# Patient Record
Sex: Male | Born: 1960
Health system: Southern US, Community
[De-identification: ages and names within clinical notes are randomized; demographics above are authoritative.]

---

## 2001-02-08 ENCOUNTER — Other Ambulatory Visit: Admission: RE | Admit: 2001-02-08 | Discharge: 2001-02-08 | Payer: Self-pay | Admitting: Urology

## 2003-01-30 ENCOUNTER — Emergency Department (HOSPITAL_COMMUNITY): Admission: EM | Admit: 2003-01-30 | Discharge: 2003-01-30 | Payer: Self-pay | Admitting: Emergency Medicine

## 2014-10-09 ENCOUNTER — Other Ambulatory Visit: Payer: Self-pay

## 2014-10-09 ENCOUNTER — Other Ambulatory Visit: Payer: Self-pay | Admitting: Family Medicine

## 2014-10-09 ENCOUNTER — Other Ambulatory Visit (HOSPITAL_COMMUNITY): Payer: Self-pay | Admitting: Family Medicine

## 2014-10-09 ENCOUNTER — Ambulatory Visit (HOSPITAL_COMMUNITY): Payer: BLUE CROSS/BLUE SHIELD | Attending: Cardiovascular Disease

## 2014-10-09 DIAGNOSIS — R002 Palpitations: Secondary | ICD-10-CM

## 2016-07-25 ENCOUNTER — Ambulatory Visit (INDEPENDENT_AMBULATORY_CARE_PROVIDER_SITE_OTHER): Payer: BLUE CROSS/BLUE SHIELD | Admitting: Internal Medicine

## 2016-07-25 DIAGNOSIS — Z9189 Other specified personal risk factors, not elsewhere classified: Secondary | ICD-10-CM

## 2016-07-25 DIAGNOSIS — Z789 Other specified health status: Secondary | ICD-10-CM | POA: Diagnosis not present

## 2016-07-25 DIAGNOSIS — Z23 Encounter for immunization: Secondary | ICD-10-CM

## 2016-07-25 DIAGNOSIS — Z7184 Encounter for health counseling related to travel: Secondary | ICD-10-CM

## 2016-07-25 DIAGNOSIS — Z7189 Other specified counseling: Secondary | ICD-10-CM | POA: Diagnosis not present

## 2016-07-25 MED ORDER — AZITHROMYCIN 500 MG PO TABS: 500.0000 mg | ORAL_TABLET | Freq: Every day | ORAL | 0 refills | Status: DC

## 2016-07-25 NOTE — Progress Notes (Signed)
   Subjective:   Jacobee Heyman is a 56 y.o. male who presents to the Infectious Disease clinic for travel consultation. Planned departure date: 3/31 Planned return date: 4/3 Countries of travel: [place] Areas in country: urban Accommodations: hotel. Plus camping Purpose of travel: white water rafting trip with family of 8 Prior travel out of [id number], [place], [place], [place], [place]  Had tdap in 2016   Objective:   Medications:none    Assessment:   No contraindications to travel. none   Plan:   Mosquito bite prevention = discussed how to prevent bites with DEET and premethrin  Pre travel vaccines = will give hep A #1  Traveler's diarrhea = gave recs for avoidance of tap water, stick with bottled water, can use peptobismal, immodium and if needed azithromycin   [facility]

## 2016-07-25 NOTE — Patient Instructions (Signed)
[facility]                [address], [place]                   [place],  [address]                      Phone: [phone]                        Fax: [phone]   Planned departure date: 3/31   Planned return date: 4/4 Countries of travel:  [place]  Guidelines for the Prevention & Treatment of Traveler's Diarrhea  Prevention: "Boil it, Peel it, Lacinda Axon it, or Forget it"   the fewer chances -> lower risk: try to stick to food & water precautions as much as possible"   If it's "piping hot"; it is probably okay, if not, it may not be   Treatment   1) You should always take care to drink lots of fluids in order to avoid dehydration   2) You should bring medications with you in case you come down with a case of diarrhea   3) OTC = bring pepto-bismol - can take with initial abdominal symptoms;                    Imodium - can help slow down your intestinal tract, can help relief cramps                    and diarrhea, can take if no bloody diarrhea  Use azithromycin if needed for traveler's diarrhea  Guidelines for the Prevention of Malaria  Avoidance:  -fewer mosquito bites = lower risk. Mosquitos can bite at night as well as daytime  -cover up (long sleeve clothing), mosquito nets, screens  -Insect repellent for your skin ( DEET containing lotion > 20%): for clothes ( permethrin spray)     Immunizations received today: hepatitis A, 1st dose Future immunizations, if indicated -- hepatitis A, 2nd dose so that you have life long immunity  Prior to travel:  1) Be sure to pick up appropriate prescriptions, including medicine you take daily. Do not expect to be able to fill your prescriptions abroad.  2) Strongly consider obtaining traveler's insurance, including emergency evacuation insurance. Most plans in the [place] do not cover participants abroad. (see below for resources)  3) Register at the appropriate [place]  embassy or consulate with travel dates so they are aware of your presence in-country and for helpful advice during travel using the [employer] (STEP, GreenNylon.com.cy).  4) Leave contact information with a relative or friend.  5) Keep a Research officer, political party, credit cards in case they become lost or stolen  6) Inform your credit card company that you will be travelling abroad   During travel:  1) If you become ill and need medical advice, the [place] [employer]	[place] of the country you are traveling in general provides a list of [place] speaking doctors.  We are also available on MyChart for remote consultation if you register prior to travel. 2) Avoid motorcycles or scooters when at all possible. Traffic laws in many countries are lax and accidents occur frequently.  3) Do not take any unnecessary risks that you wouldn't do at home.   Resources:  -Country specific information: BlindResource.ca or GreenNylon.com.cy  -Press photographer (DEET,  mosquito nets): [employer], [employer] store, [employer], [place] insurance options: gatewayplans.com; http://clayton-rivera.info/; travelguard.com or [employer], gninsurance.com or info@gninsurance .com, (747) 315-8485.   Post Travel:  If you return from your trip ill, call your primary care doctor or our travel clinic @ [phone].   Enjoy your trip and know that with proper pre-travel preparation, most people have an enjoyable and uninterrupted trip!

## 2016-10-07 DIAGNOSIS — R21 Rash and other nonspecific skin eruption: Secondary | ICD-10-CM | POA: Diagnosis not present

## 2017-08-29 DIAGNOSIS — F4320 Adjustment disorder, unspecified: Secondary | ICD-10-CM | POA: Diagnosis not present

## 2017-09-05 DIAGNOSIS — F4320 Adjustment disorder, unspecified: Secondary | ICD-10-CM | POA: Diagnosis not present

## 2017-09-16 DIAGNOSIS — M25529 Pain in unspecified elbow: Secondary | ICD-10-CM | POA: Diagnosis not present

## 2017-09-19 DIAGNOSIS — F4320 Adjustment disorder, unspecified: Secondary | ICD-10-CM | POA: Diagnosis not present

## 2017-10-05 ENCOUNTER — Ambulatory Visit: Payer: BLUE CROSS/BLUE SHIELD | Admitting: Family Medicine

## 2017-10-10 DIAGNOSIS — F4320 Adjustment disorder, unspecified: Secondary | ICD-10-CM | POA: Diagnosis not present

## 2017-10-23 DIAGNOSIS — L723 Sebaceous cyst: Secondary | ICD-10-CM | POA: Diagnosis not present

## 2017-10-23 DIAGNOSIS — L989 Disorder of the skin and subcutaneous tissue, unspecified: Secondary | ICD-10-CM | POA: Diagnosis not present

## 2017-10-23 DIAGNOSIS — G5612 Other lesions of median nerve, left upper limb: Secondary | ICD-10-CM | POA: Diagnosis not present

## 2017-10-24 DIAGNOSIS — F4320 Adjustment disorder, unspecified: Secondary | ICD-10-CM | POA: Diagnosis not present

## 2017-11-14 DIAGNOSIS — F4320 Adjustment disorder, unspecified: Secondary | ICD-10-CM | POA: Diagnosis not present

## 2017-12-05 DIAGNOSIS — F4320 Adjustment disorder, unspecified: Secondary | ICD-10-CM | POA: Diagnosis not present

## 2017-12-19 DIAGNOSIS — F4320 Adjustment disorder, unspecified: Secondary | ICD-10-CM | POA: Diagnosis not present

## 2017-12-23 DIAGNOSIS — Z0000 Encounter for general adult medical examination without abnormal findings: Secondary | ICD-10-CM | POA: Diagnosis not present

## 2017-12-23 DIAGNOSIS — R7301 Impaired fasting glucose: Secondary | ICD-10-CM | POA: Diagnosis not present

## 2017-12-23 DIAGNOSIS — G5612 Other lesions of median nerve, left upper limb: Secondary | ICD-10-CM | POA: Diagnosis not present

## 2017-12-23 DIAGNOSIS — Z720 Tobacco use: Secondary | ICD-10-CM | POA: Diagnosis not present

## 2017-12-26 DIAGNOSIS — F4320 Adjustment disorder, unspecified: Secondary | ICD-10-CM | POA: Diagnosis not present

## 2018-01-05 DIAGNOSIS — L700 Acne vulgaris: Secondary | ICD-10-CM | POA: Diagnosis not present

## 2018-01-16 DIAGNOSIS — F4320 Adjustment disorder, unspecified: Secondary | ICD-10-CM | POA: Diagnosis not present

## 2018-01-30 DIAGNOSIS — F4320 Adjustment disorder, unspecified: Secondary | ICD-10-CM | POA: Diagnosis not present

## 2018-02-26 DIAGNOSIS — Z86010 Personal history of colonic polyps: Secondary | ICD-10-CM | POA: Diagnosis not present

## 2018-02-26 DIAGNOSIS — D123 Benign neoplasm of transverse colon: Secondary | ICD-10-CM | POA: Diagnosis not present

## 2018-02-26 DIAGNOSIS — K640 First degree hemorrhoids: Secondary | ICD-10-CM | POA: Diagnosis not present

## 2018-03-02 DIAGNOSIS — Z86010 Personal history of colonic polyps: Secondary | ICD-10-CM | POA: Diagnosis not present

## 2018-03-02 DIAGNOSIS — D123 Benign neoplasm of transverse colon: Secondary | ICD-10-CM | POA: Diagnosis not present

## 2018-03-06 DIAGNOSIS — F4320 Adjustment disorder, unspecified: Secondary | ICD-10-CM | POA: Diagnosis not present

## 2018-03-16 DIAGNOSIS — R7989 Other specified abnormal findings of blood chemistry: Secondary | ICD-10-CM | POA: Diagnosis not present

## 2018-03-16 DIAGNOSIS — F17210 Nicotine dependence, cigarettes, uncomplicated: Secondary | ICD-10-CM | POA: Diagnosis not present

## 2018-03-16 DIAGNOSIS — R079 Chest pain, unspecified: Secondary | ICD-10-CM | POA: Diagnosis not present

## 2018-03-16 DIAGNOSIS — R0789 Other chest pain: Secondary | ICD-10-CM | POA: Diagnosis not present

## 2018-04-17 DIAGNOSIS — F4320 Adjustment disorder, unspecified: Secondary | ICD-10-CM | POA: Diagnosis not present

## 2018-05-08 DIAGNOSIS — F4320 Adjustment disorder, unspecified: Secondary | ICD-10-CM | POA: Diagnosis not present

## 2018-06-24 DIAGNOSIS — D229 Melanocytic nevi, unspecified: Secondary | ICD-10-CM | POA: Diagnosis not present

## 2018-06-24 DIAGNOSIS — L723 Sebaceous cyst: Secondary | ICD-10-CM | POA: Diagnosis not present

## 2018-07-17 DIAGNOSIS — F4320 Adjustment disorder, unspecified: Secondary | ICD-10-CM | POA: Diagnosis not present

## 2018-09-23 DIAGNOSIS — F4320 Adjustment disorder, unspecified: Secondary | ICD-10-CM | POA: Diagnosis not present

## 2018-11-02 DIAGNOSIS — F4320 Adjustment disorder, unspecified: Secondary | ICD-10-CM | POA: Diagnosis not present

## 2019-03-16 DIAGNOSIS — E7800 Pure hypercholesterolemia, unspecified: Secondary | ICD-10-CM | POA: Diagnosis not present

## 2019-03-16 DIAGNOSIS — Z125 Encounter for screening for malignant neoplasm of prostate: Secondary | ICD-10-CM | POA: Diagnosis not present

## 2019-03-16 DIAGNOSIS — Z131 Encounter for screening for diabetes mellitus: Secondary | ICD-10-CM | POA: Diagnosis not present

## 2019-03-16 DIAGNOSIS — Z23 Encounter for immunization: Secondary | ICD-10-CM | POA: Diagnosis not present

## 2019-03-16 DIAGNOSIS — Z0000 Encounter for general adult medical examination without abnormal findings: Secondary | ICD-10-CM | POA: Diagnosis not present

## 2019-04-13 DIAGNOSIS — Z23 Encounter for immunization: Secondary | ICD-10-CM | POA: Diagnosis not present

## 2019-06-14 DIAGNOSIS — Z23 Encounter for immunization: Secondary | ICD-10-CM | POA: Diagnosis not present

## 2019-08-23 ENCOUNTER — Ambulatory Visit: Payer: BC Managed Care – PPO | Admitting: Physician Assistant

## 2019-08-23 ENCOUNTER — Encounter: Payer: Self-pay | Admitting: Physician Assistant

## 2019-08-23 ENCOUNTER — Other Ambulatory Visit: Payer: Self-pay

## 2019-08-23 DIAGNOSIS — Z1283 Encounter for screening for malignant neoplasm of skin: Secondary | ICD-10-CM

## 2019-08-23 DIAGNOSIS — L720 Epidermal cyst: Secondary | ICD-10-CM | POA: Diagnosis not present

## 2019-08-23 DIAGNOSIS — L820 Inflamed seborrheic keratosis: Secondary | ICD-10-CM | POA: Diagnosis not present

## 2019-08-23 NOTE — Progress Notes (Addendum)
   Follow-Up Visit   Subjective  Corey Lynch is a 59 y.o. male who presents for the following: Skin Problem (Patient here today to have a few spots checked on right upper chest, left upper chest and back x couple of months.  Patient denies any bleeding from the spots, patient denies any pain from the spots.).Brown crusts. No treatment. Persistent growing.  Location: R lower eyelid Duration: 1 yr Quality: white bump Associated Signs/Symptoms: none Modifying Factors: persistent Severity: growing    The following portions of the chart were reviewed this encounter and updated as appropriate: Tobacco  Allergies  Meds  Problems  Med Hx  Surg Hx  Fam Hx  Soc Hx      Objective  Well appearing patient in no apparent distress; mood and affect are within normal limits.  Waist up plus legs examined.   Objective  Right lower eye lid: White plaque  Objective  head to toe: No atypical nevi  Objective  Left chest, Right chest: Erythematous stuck-on, waxy papule or plaque.   Assessment & Plan  Milia Right lower eye lid- I&D  Screening exam for skin cancer head to toe -No atypical nevi noted at the time of the visit.  Seborrheic keratosis, inflamed (2) Left chest; Right chest  Destruction of lesion - Left chest, Right chest Complexity: simple   Destruction method: cryotherapy   Informed consent: discussed and consent obtained   Timeout:  patient name, date of birth, surgical site, and procedure verified Lesion destroyed using liquid nitrogen: Yes   Cryotherapy cycles:  1 Outcome: patient tolerated procedure well with no complications   Post-procedure details: wound care instructions given

## 2019-08-23 NOTE — Patient Instructions (Signed)

## 2019-09-08 ENCOUNTER — Encounter: Payer: Self-pay | Admitting: General Surgery

## 2019-09-08 ENCOUNTER — Other Ambulatory Visit: Payer: Self-pay

## 2019-09-08 ENCOUNTER — Ambulatory Visit: Payer: BC Managed Care – PPO | Admitting: General Surgery

## 2019-09-08 VITALS — BP 143/86 | HR 65 | Temp 97.9°F | Resp 12 | Ht 71.0 in | Wt 205.0 lb

## 2019-09-08 DIAGNOSIS — D170 Benign lipomatous neoplasm of skin and subcutaneous tissue of head, face and neck: Secondary | ICD-10-CM | POA: Diagnosis not present

## 2019-09-08 NOTE — Patient Instructions (Signed)
Lipoma  A lipoma is a noncancerous (benign) tumor that is made up of fat cells. This is a very common type of soft-tissue growth. Lipomas are usually found under the skin (subcutaneous). They may occur in any tissue of the body that contains fat. Common areas for lipomas to appear include the back, arms, shoulders, buttocks, and thighs. Lipomas grow slowly, and they are usually painless. Most lipomas do not cause problems and do not require treatment. What are the causes? The cause of this condition is not known. What increases the risk? You are more likely to develop this condition if:  You are 40-60 years old.  You have a family history of lipomas. What are the signs or symptoms? A lipoma usually appears as a small, round bump under the skin. In most cases, the lump will:  Feel soft or rubbery.  Not cause pain or other symptoms. However, if a lipoma is located in an area where it pushes on nerves, it can become painful or cause other symptoms. How is this diagnosed? A lipoma can usually be diagnosed with a physical exam. You may also have tests to confirm the diagnosis and to rule out other conditions. Tests may include:  Imaging tests, such as a CT scan or an MRI.  Removal of a tissue sample to be looked at under a microscope (biopsy). How is this treated? Treatment for this condition depends on the size of the lipoma and whether it is causing any symptoms.  For small lipomas that are not causing problems, no treatment is needed.  If a lipoma is bigger or it causes problems, surgery may be done to remove the lipoma. Lipomas can also be removed to improve appearance. Most often, the procedure is done after applying a medicine that numbs the area (local anesthetic).  Liposuction may be done to reduce the size of the lipoma before it is removed through surgery, or it may be done to remove the lipoma. Lipomas are removed with this method in order to limit incision size and scarring. A  liposuction tube is inserted through a small incision into the lipoma, and the contents of the lipoma are removed through the tube with suction. Follow these instructions at home:  Watch your lipoma for any changes.  Keep all follow-up visits as told by your health care provider. This is important. Contact a health care provider if:  Your lipoma becomes larger or hard.  Your lipoma becomes painful, red, or increasingly swollen. These could be signs of infection or a more serious condition. Get help right away if:  You develop tingling or numbness in an area near the lipoma. This could indicate that the lipoma is causing nerve damage. Summary  A lipoma is a noncancerous tumor that is made up of fat cells.  Most lipomas do not cause problems and do not require treatment.  If a lipoma is bigger or it causes problems, surgery may be done to remove the lipoma.  Contact a health care provider if your lipoma becomes larger or hard, or if it becomes painful, red, or increasingly swollen. Pain, redness, and swelling could be signs of infection or a more serious condition. This information is not intended to replace advice given to you by your health care provider. Make sure you discuss any questions you have with your health care provider. Document Revised: 12/27/2018 Document Reviewed: 12/27/2018 Elsevier Patient Education  2020 Elsevier Inc.  

## 2019-09-09 NOTE — Progress Notes (Signed)
Corey Lynch; [id number]; November 14, 1960   HPI Patient is a 59 year old white male who was referred to my care by Dr.Koirala for evaluation treatment of a cyst on his neck.  Patient has felt some tenderness and catching of the neck when extending it over the past year.  He did feel a small lump underneath the skin in the posterior neck and was told this could be a cyst.  No drainage has been noted.  No enlargement has been noted.  Patient denies any numbness or tingling extending down the left arm.  This occurs primarily in the left side of the neck.  He currently has 0 out of 10 neck pain. History reviewed. No pertinent past medical history.  History reviewed. No pertinent surgical history.  Family History  Problem Relation Age of Onset  . Basal cell carcinoma Brother   . Heart attack Mother     Current Outpatient Medications on File Prior to Visit  Medication Sig Dispense Refill  . acetaminophen (TYLENOL) 325 MG tablet Take by mouth.     No current facility-administered medications on file prior to visit.    No Known Allergies  Social History   Substance and Sexual Activity  Alcohol Use Yes    Social History   Tobacco Use  Smoking Status Current Every Day Smoker  Smokeless Tobacco Current User  Tobacco Comment   Patient states he hasn't had a cigarette in 10 days he's trying to quit    Review of Systems  Constitutional: Negative.   HENT: Negative.   Eyes: Negative.   Respiratory: Negative.   Cardiovascular: Negative.   Gastrointestinal: Negative.   Genitourinary: Negative.   Musculoskeletal: Negative.   Skin: Negative.   Neurological: Negative.   Endo/Heme/Allergies: Negative.   Psychiatric/Behavioral: Negative.     Objective   Vitals:   09/08/19 0921  BP: (!) 143/86  Pulse: 65  Resp: 12  Temp: 97.9 F (36.6 C)  SpO2: 98%    Physical Exam Vitals reviewed.  Constitutional:      Appearance: Normal appearance. He is not ill-appearing.  HENT:     Head:  Normocephalic and atraumatic.  Neck:     Comments: Small ill-defined oval lump approximately less than 1 cm in size is noted at the base of the neck just to the left of the midline.  It appears mobile in nature.  No skin changes overlying it are noted. Cardiovascular:     Rate and Rhythm: Normal rate and regular rhythm.     Heart sounds: Normal heart sounds. No murmur. No friction rub. No gallop.   Pulmonary:     Effort: Pulmonary effort is normal. No respiratory distress.     Breath sounds: Normal breath sounds. No stridor. No wheezing, rhonchi or rales.  Musculoskeletal:     Cervical back: Normal range of motion and neck supple. No rigidity or tenderness.  Lymphadenopathy:     Cervical: No cervical adenopathy.  Skin:    General: Skin is warm and dry.  Neurological:     Mental Status: He is alert and oriented to person, place, and time.     Assessment  Benign lipoma of the neck, possible small sebaceous cyst.  I do not feel this is the etiology of his neck discomfort.  This is probably musculoskeletal in nature and removing the knot would not be helpful. Plan   I do not recommend surgical excision at this time as the subcutaneous nodule is small and not the etiology of his symptoms.  He  understands and agrees.  I told him should he develop worsening symptoms or pain or tingling down the left arm, he should see a neurosurgeon.  Follow-up as needed.

## 2019-10-11 ENCOUNTER — Encounter: Payer: Self-pay | Admitting: Family Medicine

## 2019-10-11 ENCOUNTER — Ambulatory Visit: Payer: BC Managed Care – PPO | Admitting: Family Medicine

## 2019-10-11 ENCOUNTER — Other Ambulatory Visit: Payer: Self-pay

## 2019-10-11 DIAGNOSIS — M5030 Other cervical disc degeneration, unspecified cervical region: Secondary | ICD-10-CM

## 2019-10-11 DIAGNOSIS — M999 Biomechanical lesion, unspecified: Secondary | ICD-10-CM | POA: Diagnosis not present

## 2019-10-11 NOTE — Progress Notes (Signed)
[facility] Phone: [phone] Subjective:   Corey Lynch, am serving as a scribe for Dr. Hulan Saas. This visit occurred during the SARS-CoV-2 public health emergency.  Safety protocols were in place, including screening questions prior to the visit, additional usage of staff PPE, and extensive cleaning of exam room while observing appropriate contact time as indicated for disinfecting solutions.   I'm seeing this patient by the request  of:  Koirala, Dibas, MD  CC: Neck sounds mild discomfort  [id number]  Corey Lynch is a 59 y.o. male coming in with complaint of neck pain. Feels crunching in his neck with shoulder elevation. Is not having pain but having decreased range of motion.  Patient would like to make sure that is doing it worse.  Has noticed it more over the last several months.  Denies any radiation in the arms.  More of the sound that is more concerning than truly pain.     History reviewed. Lynch pertinent past medical history. History reviewed. Lynch pertinent surgical history. Social History   Socioeconomic History  . Marital status: Married    Spouse name: Not on file  . Number of children: Not on file  . Years of education: Not on file  . Highest education level: Not on file  Occupational History  . Not on file  Tobacco Use  . Smoking status: Current Every Day Smoker  . Smokeless tobacco: Current User  . Tobacco comment: Patient states he hasn't had a cigarette in 10 days he's trying to quit  Substance and Sexual Activity  . Alcohol use: Yes  . Drug use: Never  . Sexual activity: Not on file  Other Topics Concern  . Not on file  Social History Narrative  . Not on file   Social Determinants of Health   Financial Resource Strain:   . Difficulty of Paying Living Expenses:   Food Insecurity:   . Worried About Charity fundraiser in the Last Year:   . Arboriculturist in the Last  Year:   Transportation Needs:   . Film/video editor (Medical):   Marland Kitchen Lack of Transportation (Non-Medical):   Physical Activity:   . Days of Exercise per Week:   . Minutes of Exercise per Session:   Stress:   . Feeling of Stress :   Social Connections:   . Frequency of Communication with Friends and Family:   . Frequency of Social Gatherings with Friends and Family:   . Attends Religious Services:   . Active Member of Clubs or Organizations:   . Attends Archivist Meetings:   Marland Kitchen Marital Status:    Lynch Known Allergies Family History  Problem Relation Age of Onset  . Basal cell carcinoma Brother   . Heart attack Mother        Current Outpatient Medications (Analgesics):  .  acetaminophen (TYLENOL) 325 MG tablet, Take by mouth.     Reviewed prior external information including notes and imaging from  primary care provider As well as notes that were available from care everywhere and other healthcare systems.  Past medical history, social, surgical and family history all reviewed in electronic medical record.  Lynch pertanent information unless stated regarding to the chief complaint.   Review of Systems:  Lynch headache, visual changes, nausea, vomiting, diarrhea, constipation, dizziness, abdominal pain, skin rash, fevers, chills, night sweats, weight loss, swollen lymph nodes, body aches, joint swelling, chest  pain, shortness of breath, mood changes. POSITIVE muscle aches  Objective  Blood pressure 114/72, pulse 62, height 5\' 11"  (1.803 m), weight 201 lb (91.2 kg), SpO2 99 %.   General: Lynch apparent distress alert and oriented x3 mood and affect normal, dressed appropriately.  HEENT: Pupils equal, extraocular movements intact  Respiratory: Patient's speak in full sentences and does not appear short of breath  Cardiovascular: Lynch lower extremity edema, non tender, Lynch erythema  Neuro: Cranial nerves II through XII are intact, neurovascularly intact in all extremities  with 2+ DTRs and 2+ pulses.  Gait normal with good balance and coordination.  MSK:  Non tender with full range of motion and good stability and symmetric strength and tone of shoulders, elbows, wrist, hip, knee and ankles bilaterally.  Neck exam shows the patient does have some mild loss of lordosis.  Patient does have forward displacement of the neck.  Mild scapular dyskinesis on the left greater than the right.  Tightness noted.  Patient does have a lipoma over the C4-C5 area.  Negative Spurling's.  5-5 strength of the upper extremities.  Osteopathic findings  C4 flexed rotated and side bent left C6 flexed rotated and side bent left T3 extended rotated and side bent left inhaled third rib   [id number]; 15 additional minutes spent for Therapeutic exercises as stated in above notes.  This included exercises focusing on stretching, strengthening, with significant focus on eccentric aspects.   Long term goals include an improvement in range of motion, strength, endurance as well as avoiding reinjury. Patient's frequency would include in 1-2 times a day, 3-5 times a week for a duration of 6-12 weeks. Shoulder Exercises that included:  Basic scapular stabilization to include adduction and depression of scapula Scaption, focusing on proper movement and good control Internal and External rotation utilizing a theraband, with elbow tucked at side entire time Rows with theraband which was given   Proper technique shown and discussed handout in great detail with ATC.  All questions were discussed and answered.       Impression and Recommendations:     This case required medical decision making of moderate complexity. The above documentation has been reviewed and is accurate and complete Lyndal Pulley, DO       Note: This dictation was prepared with Dragon dictation along with smaller phrase technology. Any transcriptional errors that result from this process are unintentional.

## 2019-10-11 NOTE — Assessment & Plan Note (Signed)
   Decision today to treat with OMT was based on Physical Exam  After verbal consent patient was treated with HVLA, ME, FPR techniques in cervical, thoracic areas,   Patient tolerated the procedure well with improvement in symptoms  Patient given exercises, stretches and lifestyle modifications  See medications in patient instructions if given  Patient will follow up in 4-8 weeks

## 2019-10-11 NOTE — Patient Instructions (Signed)
Scapular exercises Monitor at eye level Wireless keyboard and mouse Keep shoulder blades back when sitting long time See me in 6-8 weeks

## 2019-10-11 NOTE — Assessment & Plan Note (Signed)
Mild overall, left-sided lipoma noted at the C4-C5 level.  Seems to be freely movable and minorly tender.  Patient does have very mild crepitus.  Does have some scapular dyskinesis.  Responded well to osteopathic manipulation.  Discussed the over-the-counter Tylenol.  Patient would like to avoid any prescription medications.  We discussed ergonomics that I think would be beneficial.  Follow-up with me again 6 to 8 weeks

## 2019-11-05 DIAGNOSIS — J029 Acute pharyngitis, unspecified: Secondary | ICD-10-CM | POA: Diagnosis not present

## 2019-11-05 DIAGNOSIS — Z03818 Encounter for observation for suspected exposure to other biological agents ruled out: Secondary | ICD-10-CM | POA: Diagnosis not present

## 2019-11-22 ENCOUNTER — Ambulatory Visit: Payer: BC Managed Care – PPO | Admitting: Family Medicine

## 2019-12-09 DIAGNOSIS — S61012A Laceration without foreign body of left thumb without damage to nail, initial encounter: Secondary | ICD-10-CM | POA: Diagnosis not present

## 2019-12-20 DIAGNOSIS — Z4802 Encounter for removal of sutures: Secondary | ICD-10-CM | POA: Diagnosis not present

## 2020-04-04 DIAGNOSIS — Z23 Encounter for immunization: Secondary | ICD-10-CM | POA: Diagnosis not present

## 2020-04-04 DIAGNOSIS — R7301 Impaired fasting glucose: Secondary | ICD-10-CM | POA: Diagnosis not present

## 2020-04-04 DIAGNOSIS — Z0000 Encounter for general adult medical examination without abnormal findings: Secondary | ICD-10-CM | POA: Diagnosis not present

## 2020-04-04 DIAGNOSIS — Z13220 Encounter for screening for lipoid disorders: Secondary | ICD-10-CM | POA: Diagnosis not present

## 2020-04-04 DIAGNOSIS — R7309 Other abnormal glucose: Secondary | ICD-10-CM | POA: Diagnosis not present

## 2020-04-04 DIAGNOSIS — Z125 Encounter for screening for malignant neoplasm of prostate: Secondary | ICD-10-CM | POA: Diagnosis not present

## 2020-04-15 DIAGNOSIS — Z20822 Contact with and (suspected) exposure to covid-19: Secondary | ICD-10-CM | POA: Diagnosis not present

## 2020-04-30 ENCOUNTER — Telehealth: Payer: Self-pay | Admitting: Acute Care

## 2020-04-30 DIAGNOSIS — F17210 Nicotine dependence, cigarettes, uncomplicated: Secondary | ICD-10-CM

## 2020-05-03 NOTE — Telephone Encounter (Signed)
Spoke with pt and scheduled SDMV 06/04/20  CT ordered Nothing further needed

## 2020-05-18 DIAGNOSIS — Z03818 Encounter for observation for suspected exposure to other biological agents ruled out: Secondary | ICD-10-CM | POA: Diagnosis not present

## 2020-05-18 DIAGNOSIS — R051 Acute cough: Secondary | ICD-10-CM | POA: Diagnosis not present

## 2020-06-04 ENCOUNTER — Ambulatory Visit
Admission: RE | Admit: 2020-06-04 | Discharge: 2020-06-04 | Disposition: A | Discharge status: Home / Self Care | Payer: BC Managed Care – PPO | Source: Ambulatory Visit | Attending: Acute Care | Admitting: Acute Care

## 2020-06-04 ENCOUNTER — Other Ambulatory Visit: Payer: Self-pay

## 2020-06-04 ENCOUNTER — Ambulatory Visit (INDEPENDENT_AMBULATORY_CARE_PROVIDER_SITE_OTHER): Payer: BC Managed Care – PPO | Admitting: Acute Care

## 2020-06-04 ENCOUNTER — Encounter: Payer: Self-pay | Admitting: Acute Care

## 2020-06-04 VITALS — BP 120/72 | HR 64 | Temp 97.1°F | Ht 73.0 in | Wt 210.0 lb

## 2020-06-04 DIAGNOSIS — J439 Emphysema, unspecified: Secondary | ICD-10-CM | POA: Diagnosis not present

## 2020-06-04 DIAGNOSIS — Z87891 Personal history of nicotine dependence: Secondary | ICD-10-CM | POA: Diagnosis not present

## 2020-06-04 DIAGNOSIS — F17210 Nicotine dependence, cigarettes, uncomplicated: Secondary | ICD-10-CM | POA: Diagnosis not present

## 2020-06-04 DIAGNOSIS — I700 Atherosclerosis of aorta: Secondary | ICD-10-CM | POA: Diagnosis not present

## 2020-06-04 DIAGNOSIS — I2510 Atherosclerotic heart disease of native coronary artery without angina pectoris: Secondary | ICD-10-CM | POA: Diagnosis not present

## 2020-06-04 NOTE — Progress Notes (Addendum)
Shared Decision Making Visit Lung Cancer Screening Program [phone])   Eligibility:  Age 60 y.o.  Pack Years Smoking History Calculation 54 pack year smoking history (# packs/per year x # years smoked)  Recent History of coughing up blood  no  Unexplained weight loss? no ( >Than 15 pounds within the last 6 months )  Prior History Lung / other cancer no (Diagnosis within the last 5 years already requiring surveillance chest CT Scans).  Smoking Status Current Smoker  Former Smokers: Years since quit: NA  Quit Date: NA  Visit Components:  Discussion included one or more decision making aids. yes  Discussion included risk/benefits of screening. yes  Discussion included potential follow up diagnostic testing for abnormal scans. yes  Discussion included meaning and risk of over diagnosis. yes  Discussion included meaning and risk of False Positives. yes  Discussion included meaning of total radiation exposure. yes  Counseling Included:  Importance of adherence to annual lung cancer LDCT screening. yes  Impact of comorbidities on ability to participate in the program. yes  Ability and willingness to under diagnostic treatment. yes  Smoking Cessation Counseling:  Current Smokers:   Discussed importance of smoking cessation. yes  Information about tobacco cessation classes and interventions provided to patient. yes  Patient provided with "ticket" for LDCT Scan. yes  Symptomatic Patient. no  CounselingNA  Diagnosis Code: Tobacco Use Z72.0  Asymptomatic Patient yes  Counseling (Intermediate counseling: > three minutes counseling) Q7341  Former Smokers:   Discussed the importance of maintaining cigarette abstinence. yes  Diagnosis Code: Personal History of Nicotine Dependence. P37.902  Information about tobacco cessation classes and interventions provided to patient. Yes  Patient provided with "ticket" for LDCT Scan. yes  Written Order for Lung Cancer  Screening with LDCT placed in Epic. Yes (CT Chest Lung Cancer Screening Low Dose W/O CM) [id number] Z12.2-Screening of respiratory organs Z87.891-Personal history of nicotine dependence  BP 120/72 (BP Location: Left Arm, Cuff Size: Normal)   Pulse 64   Temp (!) 97.1 F (36.2 C) (Oral)   Ht 6\' 1"  (1.854 m)   Wt 210 lb (95.3 kg)   SpO2 100%   BMI 27.71 kg/m    I have spent 25 minutes of face to face time with Mr. Carras discussing the risks and benefits of lung cancer screening. We viewed a power point together that explained in detail the above noted topics. We paused at intervals to allow for questions to be asked and answered to ensure understanding.We discussed that the single most powerful action that he can take to decrease his risk of developing lung cancer is to quit smoking. We discussed whether or not he is ready to commit to setting a quit date. We discussed options for tools to aid in quitting smoking including nicotine replacement therapy, non-nicotine medications, support groups, Quit Smart classes, and behavior modification. We discussed that often times setting smaller, more achievable goals, such as eliminating 1 cigarette a day for a week and then 2 cigarettes a day for a week can be helpful in slowly decreasing the number of cigarettes smoked. This allows for a sense of accomplishment as well as providing a clinical benefit. I gave him the " Be Stronger Than Your Excuses" card with contact information for community resources, classes, free nicotine replacement therapy, and access to mobile apps, text messaging, and on-line smoking cessation help. I have also given him my card and contact information in the event he needs to contact me. We discussed the time  and location of the scan, and that either Doroteo Glassman RN or I will call with the results within 24-48 hours of receiving them. I have offered him  a copy of the power point we viewed  as a resource in the event they need  reinforcement of the concepts we discussed today in the office. The patient verbalized understanding of all of  the above and had no further questions upon leaving the office. They have my contact information in the event they have any further questions.  I spent 4 minutes counseling on smoking cessation and the health risks of continued tobacco abuse.  I explained to the patient that there has been a high incidence of coronary artery disease noted on these exams. I explained that this is a non-gated exam therefore degree or severity cannot be determined. This patient is not on statin therapy. I have asked the patient to follow-up with their PCP regarding any incidental finding of coronary artery disease and management with diet or medication as their PCP  feels is clinically indicated. The patient verbalized understanding of the above and had no further questions upon completion of the visit.      Magdalen Spatz, NP 06/04/2020

## 2020-06-04 NOTE — Patient Instructions (Signed)
Thank you for participating in the [facility]. It was our pleasure to meet you today. We will call you with the results of your scan within the next few days. Your scan will be assigned a Lung RADS category score by the physicians reading the scans.  This Lung RADS score determines follow up scanning.  See below for description of categories, and follow up screening recommendations. We will be in touch to schedule your follow up screening annually or based on recommendations of our providers. We will fax a copy of your scan results to your Primary Care Physician, or the physician who referred you to the program, to ensure they have the results. Please call the office if you have any questions or concerns regarding your scanning experience or results.  Our office number is [phone]. Please speak with Denise Phelps, RN. She is our Lung Cancer Screening RN. If she is unavailable when you call, please have the office staff send her a message. She will return your call at her earliest convenience. Remember, if your scan is normal, we will scan you annually as long as you continue to meet the criteria for the program. (Age 55-77, Current smoker or smoker who has quit within the last 15 years). If you are a smoker, remember, quitting is the single most powerful action that you can take to decrease your risk of lung cancer and other pulmonary, breathing related problems. We know quitting is hard, and we are here to help.  Please let us know if there is anything we can do to help you meet your goal of quitting. If you are a former smoker, congratulations. We are proud of you! Remain smoke free! Remember you can refer friends or family members through the number above.  We will screen them to make sure they meet criteria for the program. Thank you for helping us take better care of you by participating in Lung Screening.  Lung RADS Categories:  Lung RADS 1: no nodules  or definitely non-concerning nodules.  Recommendation is for a repeat annual scan in 12 months.  Lung RADS 2:  nodules that are non-concerning in appearance and behavior with a very low likelihood of becoming an active cancer. Recommendation is for a repeat annual scan in 12 months.  Lung RADS 3: nodules that are probably non-concerning , includes nodules with a low likelihood of becoming an active cancer.  Recommendation is for a 6-month repeat screening scan. Often noted after an upper respiratory illness. We will be in touch to make sure you have no questions, and to schedule your 6-month scan.  Lung RADS 4 A: nodules with concerning findings, recommendation is most often for a follow up scan in 3 months or additional testing based on our provider's assessment of the scan. We will be in touch to make sure you have no questions and to schedule the recommended 3 month follow up scan.  Lung RADS 4 B:  indicates findings that are concerning. We will be in touch with you to schedule additional diagnostic testing based on our provider's  assessment of the scan.   

## 2020-06-07 NOTE — Progress Notes (Signed)
Please call patient and let them  know their  low dose Ct was read as a Lung RADS 2: nodules that are benign in appearance and behavior with a very low likelihood of becoming a clinically active cancer due to size or lack of growth. Recommendation per radiology is for a repeat LDCT in 12 months. .Please let them  know we will order and schedule their  annual screening scan for 05/2021. Please let them  know there was notation of CAD on their  scan.  Please remind the patient  that this is a non-gated exam therefore degree or severity of disease  cannot be determined. Please have them  follow up with their PCP regarding potential risk factor modification, dietary therapy or pharmacologic therapy if clinically indicated. Pt.  is  not currently on statin therapy per Epic data base. Please place order for annual  screening scan for  05/2021 and fax results to PCP. Thanks so much.  Langley Gauss, I cannot see that this patient is followed by cards. No statin, Have patient follow up with PCP regarding finding of Aortic atherosis. Thanks so much

## 2020-06-21 ENCOUNTER — Other Ambulatory Visit: Payer: Self-pay | Admitting: *Deleted

## 2020-06-21 DIAGNOSIS — F17210 Nicotine dependence, cigarettes, uncomplicated: Secondary | ICD-10-CM

## 2021-07-16 ENCOUNTER — Telehealth: Payer: Self-pay | Admitting: Acute Care

## 2021-07-16 NOTE — Telephone Encounter (Signed)
Left message for pt to call back to schedule f/u lung screening CT scan.  ?

## 2021-07-19 ENCOUNTER — Other Ambulatory Visit: Payer: Self-pay | Admitting: *Deleted

## 2021-07-19 DIAGNOSIS — F17210 Nicotine dependence, cigarettes, uncomplicated: Secondary | ICD-10-CM

## 2021-08-02 ENCOUNTER — Ambulatory Visit
Admission: RE | Admit: 2021-08-02 | Discharge: 2021-08-02 | Disposition: A | Discharge status: Home / Self Care | Payer: BC Managed Care – PPO | Source: Ambulatory Visit | Attending: Acute Care | Admitting: Acute Care

## 2021-08-02 DIAGNOSIS — F17210 Nicotine dependence, cigarettes, uncomplicated: Secondary | ICD-10-CM

## 2021-08-05 ENCOUNTER — Other Ambulatory Visit: Payer: Self-pay | Admitting: Acute Care

## 2021-08-05 DIAGNOSIS — F17210 Nicotine dependence, cigarettes, uncomplicated: Secondary | ICD-10-CM

## 2021-08-26 ENCOUNTER — Other Ambulatory Visit (HOSPITAL_COMMUNITY): Payer: Self-pay | Admitting: Family Medicine

## 2021-08-26 DIAGNOSIS — E7800 Pure hypercholesterolemia, unspecified: Secondary | ICD-10-CM

## 2021-08-30 ENCOUNTER — Ambulatory Visit (HOSPITAL_COMMUNITY)
Admission: RE | Admit: 2021-08-30 | Discharge: 2021-08-30 | Disposition: A | Discharge status: Home / Self Care | Payer: Self-pay | Source: Ambulatory Visit | Attending: Family Medicine | Admitting: Family Medicine

## 2021-08-30 DIAGNOSIS — E7800 Pure hypercholesterolemia, unspecified: Secondary | ICD-10-CM | POA: Insufficient documentation

## 2021-09-24 ENCOUNTER — Telehealth: Payer: Self-pay

## 2021-09-24 NOTE — Telephone Encounter (Signed)
NOTES SCANNED O REFERRAL ?

## 2021-09-30 ENCOUNTER — Ambulatory Visit: Payer: BC Managed Care – PPO | Admitting: Internal Medicine

## 2021-09-30 ENCOUNTER — Encounter: Payer: Self-pay | Admitting: Internal Medicine

## 2021-09-30 VITALS — BP 126/70 | HR 79 | Ht 71.0 in | Wt 200.0 lb

## 2021-09-30 DIAGNOSIS — R931 Abnormal findings on diagnostic imaging of heart and coronary circulation: Secondary | ICD-10-CM | POA: Diagnosis not present

## 2021-09-30 MED ORDER — ASPIRIN EC 81 MG PO TBEC: 81.0000 mg | DELAYED_RELEASE_TABLET | Freq: Every day | ORAL | 3 refills | Status: DC

## 2021-09-30 NOTE — Patient Instructions (Addendum)
Medication Instructions:  ?START: ASPIRIN '81mg'$  ONCE DAILY  ? ?Testing/Procedures: ?Your physician has requested that you have an exercise stress myoview. For further information please visit [email or web address]. Please follow instruction sheet, as given. ? ?Follow-Up: ?At [facility], you and your health needs are our priority.  As part of our continuing mission to provide you with exceptional heart care, we have created designated Provider Care Teams.  These Care Teams include your primary Cardiologist (physician) and Advanced Practice Providers (APPs -  Physician Assistants and Nurse Practitioners) who all work together to provide you with the care you need, when you need it. ? ?We recommend signing up for the patient portal called "MyChart".  Sign up information is provided on this After Visit Summary.  MyChart is used to connect with patients for Virtual Visits (Telemedicine).  Patients are able to view lab/test results, encounter notes, upcoming appointments, etc.  Non-urgent messages can be sent to your provider as well.   ?To learn more about what you can do with MyChart, go to [email or web address].   ? ?Your next appointment:   ?3 month(s) ? ?The format for your next appointment:   ?In Person ? ?Provider:   ?Dr. Harl Bowie ? ?Important Information About Sugar ? ? ? ? ? ? ?

## 2021-09-30 NOTE — Progress Notes (Signed)
?Cardiology Office Note:   ? ?Date:  09/30/2021  ? ?ID:  Corey Lynch, DOB 03/21/1961, MRN [id number] ? ?PCP:  Lujean Amel, MD ?  ?[facility] Providers ?Cardiologist:  None    ? ?Referring MD: Lujean Amel, MD  ? ?No chief complaint on file. ?Elevated CAC ? ?History of Present Illness:   ? ?Corey Lynch is a 61 y.o. male with a hx of smoking, referral for CAD noted on CT; CAC of 676 95th percentile ? ?He has hx of smoking and had a non gated CT that showed CAD. He had a CAC score that showed signficantly elevated CAC. His PCP recently increased him to atorvastatin 20 mg daily from 10 mg. He plays basketball and does well. He has no symptoms.  ? ? ?08/30/2021 ?FINDINGS: ? ?Coronary Calcium Score: ?  ?Left main: 0 ?  ?Left anterior descending artery: 394 ?  ?Left circumflex artery: 164 ?  ?Right coronary artery: 118 ?  ?Total: 676 ?  ?Percentile: 95th ?  ?Pericardium: Normal. ?  ?Non-cardiac: See separate report from [facility]. ?  ?IMPRESSION: ?Coronary calcium score of 676. This was 95th percentile for age-, ?race-, and sex-matched controls. ? ?Current Medications: ?Current Outpatient Medications on File Prior to Visit  ?Medication Sig Dispense Refill  ? atorvastatin (LIPITOR) 20 MG tablet 1 tablet    ? varenicline (CHANTIX) 1 MG tablet 1/2 pill once day for 3 days, then 1/2 pill twice a day for 4 days, then 1 pill twice a day thereafter    ? ?No current facility-administered medications on file prior to visit.  ? ? ? ? ?Allergies:   Patient has no known allergies.  ? ?Social History  ? ?Socioeconomic History  ? Marital status: Married  ?  Spouse name: Not on file  ? Number of children: Not on file  ? Years of education: Not on file  ? Highest education level: Not on file  ?Occupational History  ? Not on file  ?Tobacco Use  ? Smoking status: Every Day  ?  Packs/day: 0.25  ?  Types: Cigarettes  ? Smokeless tobacco: Current  ? Tobacco comments:  ?  smoking 1/3 pack daily 06/04/2020  ?Substance and Sexual  Activity  ? Alcohol use: Yes  ? Drug use: Never  ? Sexual activity: Not on file  ?Other Topics Concern  ? Not on file  ?Social History Narrative  ? Not on file  ? ?Social Determinants of Health  ? ?Financial Resource Strain: Not on file  ?Food Insecurity: Not on file  ?Transportation Needs: Not on file  ?Physical Activity: Not on file  ?Stress: Not on file  ?Social Connections: Not on file  ?  ? ?Family History: ?The patient's family history includes Basal cell carcinoma in his brother; Heart attack in his mother. ? ?ROS:   ?Please see the history of present illness.    ? All other systems reviewed and are negative. ? ?EKGs/Labs/Other Studies Reviewed:   ? ?The following studies were reviewed today: ? ? ?EKG:  EKG is  ordered today.  The ekg ordered today demonstrates  ? ?5/8- NSR ? ?Recent Labs: ?No results found for requested labs within last 8760 hours.  ?Recent Lipid Panel ?No results found for: CHOL, TRIG, HDL, CHOLHDL, VLDL, LDLCALC, LDLDIRECT ? ? ?Risk Assessment/Calculations:   ?  ? ?    ? ?Physical Exam:   ? ?VS:   ?Vitals:  ? 09/30/21 1441  ?BP: 126/70  ?Pulse: 79  ?SpO2: 98%  ? ? ? ? ?  Wt Readings from Last 3 Encounters:  ?09/30/21 200 lb (90.7 kg)  ?06/04/20 210 lb (95.3 kg)  ?10/11/19 201 lb (91.2 kg)  ?  ? ?GEN:  Well nourished, well developed in no acute distress ?HEENT: Normal ?NECK: No JVD; No carotid bruits ?LYMPHATICS: No lymphadenopathy ?CARDIAC: RRR, no murmurs, rubs, gallops ?RESPIRATORY:  Clear to auscultation without rales, wheezing or rhonchi  ?ABDOMEN: Soft, non-tender, non-distended ?MUSCULOSKELETAL:  No edema; No deformity  ?SKIN: Warm and dry ?NEUROLOGIC:  Alert and oriented x 3 ?PSYCHIATRIC:  Normal affect  ? ?ASSESSMENT:   ? ?Elevated CAC: he has notably elevated CAC score. He denies symptoms; however his CAC is in the 95th percentile for age. Considering this, will plan for exercise stress test. Will continue atorvastatin 20 mg and start asa 81 mg daily.  We extensively discussed the  importance of smoking cessation. LDL goal < 70 mg/dL ? ?PLAN:   ? ?In order of problems listed above: ? ?Exercise Nuclear Study ?Start aspirin 81 mg daily ?Follow up 3 months ? ?   ? ?  ?Medication Adjustments/Labs and Tests Ordered: ?Current medicines are reviewed at length with the patient today.  Concerns regarding medicines are outlined above.  ?Orders Placed This Encounter  ?Procedures  ? Cardiac Stress Test: Informed Consent Details: Physician/Practitioner Attestation; Transcribe to consent form and obtain patient signature  ? MYOCARDIAL PERFUSION IMAGING  ? EKG 12-Lead  ? ?Meds ordered this encounter  ?Medications  ? aspirin EC 81 MG tablet  ?  Sig: Take 1 tablet (81 mg total) by mouth daily. Swallow whole.  ?  Dispense:  90 tablet  ?  Refill:  3  ? ? ?Patient Instructions  ?Medication Instructions:  ?START: ASPIRIN '81mg'$  ONCE DAILY  ? ?Testing/Procedures: ?Your physician has requested that you have an exercise stress myoview. For further information please visit [email or web address]. Please follow instruction sheet, as given. ? ?Follow-Up: ?At [facility], you and your health needs are our priority.  As part of our continuing mission to provide you with exceptional heart care, we have created designated Provider Care Teams.  These Care Teams include your primary Cardiologist (physician) and Advanced Practice Providers (APPs -  Physician Assistants and Nurse Practitioners) who all work together to provide you with the care you need, when you need it. ? ?We recommend signing up for the patient portal called "MyChart".  Sign up information is provided on this After Visit Summary.  MyChart is used to connect with patients for Virtual Visits (Telemedicine).  Patients are able to view lab/test results, encounter notes, upcoming appointments, etc.  Non-urgent messages can be sent to your provider as well.   ?To learn more about what you can do with MyChart, go to [email or web address].   ? ?Your next  appointment:   ?3 month(s) ? ?The format for your next appointment:   ?In Person ? ?Provider:   ?Dr. Harl Bowie ? ?Important Information About Sugar ? ? ? ? ? ?  ? ?Signed, ?Janina Mayo, MD  ?09/30/2021 3:16 PM    ?[place] ?

## 2021-10-14 ENCOUNTER — Telehealth (HOSPITAL_COMMUNITY): Payer: Self-pay | Admitting: *Deleted

## 2021-10-14 NOTE — Telephone Encounter (Signed)
Patient given detailed instructions per Myocardial Perfusion Study Information Sheet for the test on 10/17/21 Patient notified to arrive 15 minutes early and that it is imperative to arrive on time for appointment to keep from having the test rescheduled.  If you need to cancel or reschedule your appointment, please call the office within 24 hours of your appointment. . Patient verbalized understanding.Kirstie Peri, RN

## 2021-10-17 ENCOUNTER — Ambulatory Visit (HOSPITAL_COMMUNITY): Payer: BC Managed Care – PPO | Attending: Cardiology

## 2021-10-17 ENCOUNTER — Encounter (HOSPITAL_COMMUNITY): Payer: BC Managed Care – PPO

## 2021-10-17 DIAGNOSIS — R931 Abnormal findings on diagnostic imaging of heart and coronary circulation: Secondary | ICD-10-CM | POA: Diagnosis not present

## 2021-10-17 LAB — MYOCARDIAL PERFUSION IMAGING
Angina Index: 0
Duke Treadmill Score: 9
Estimated workload: 10.1
Exercise duration (min): 9 min
Exercise duration (sec): 0 s
LV dias vol: 91 mL (ref 62–150)
LV sys vol: 36 mL
MPHR: 161 {beats}/min
Nuc Stress EF: 60 %
Peak HR: 148 {beats}/min
Percent HR: 91 %
Rest HR: 62 {beats}/min
Rest Nuclear Isotope Dose: 10.3 mCi
SDS: 0
SRS: 0
SSS: 0
ST Depression (mm): 0 mm
Stress Nuclear Isotope Dose: 31.7 mCi
TID: 0.86

## 2021-10-17 MED ORDER — TECHNETIUM TC 99M TETROFOSMIN IV KIT
10.3000 | PACK | Freq: Once | INTRAVENOUS | Status: AC | PRN
  Administered 2021-10-17: 10.3 via INTRAVENOUS

## 2021-10-17 MED ORDER — TECHNETIUM TC 99M TETROFOSMIN IV KIT
31.7000 | PACK | Freq: Once | INTRAVENOUS | Status: AC | PRN
  Administered 2021-10-17: 31.7 via INTRAVENOUS

## 2021-12-31 NOTE — Progress Notes (Unsigned)
Cardiology Office Note:    Date:  01/01/2022   ID:  Corey Lynch, DOB June 16, 1960, MRN [id number]  PCP:  Corey Amel, MD   [facility] Providers Cardiologist:  Janina Mayo, MD     Referring MD: Corey Amel, MD   No chief complaint on file. Elevated CAC  History of Present Illness:    Corey Lynch is a 61 y.o. male with a hx of smoking, referral for CAD noted on CT; CAC of 676 95th percentile  He has hx of smoking and had a non gated CT that showed CAD. He had a CAC score that showed signficantly elevated CAC. His PCP recently increased him to atorvastatin 20 mg daily from 10 mg. He plays basketball and does well. He has no symptoms.    08/30/2021 FINDINGS:  Coronary Calcium Score:   Left main: 0   Left anterior descending artery: 394   Left circumflex artery: 164   Right coronary artery: 118   Total: 676   Percentile: 95th   Pericardium: Normal.   Non-cardiac: See separate report from [facility].   IMPRESSION: Coronary calcium score of 676. This was 95th percentile for age-, race-, and sex-matched controls.  Interim Hx 8/9 He underwent exercise SPECT with high CAC. He had good exercise capacity. He was asymptomatic. There was no evidence of ischemia   Current Medications: Current Outpatient Medications on File Prior to Visit  Medication Sig Dispense Refill   atorvastatin (LIPITOR) 20 MG tablet 1 tablet     varenicline (CHANTIX) 1 MG tablet 1/2 pill once day for 3 days, then 1/2 pill twice a day for 4 days, then 1 pill twice a day thereafter     No current facility-administered medications on file prior to visit.      Allergies:   Patient has no known allergies.   Social History   Socioeconomic History   Marital status: Married    Spouse name: Not on file   Number of children: Not on file   Years of education: Not on file   Highest education level: Not on file  Occupational History   Not on file  Tobacco Use   Smoking status:  Every Day    Packs/day: 0.25    Types: Cigarettes   Smokeless tobacco: Current   Tobacco comments:    smoking 1/3 pack daily 06/04/2020  Substance and Sexual Activity   Alcohol use: Yes   Drug use: Never   Sexual activity: Not on file  Other Topics Concern   Not on file  Social History Narrative   Not on file   Social Determinants of Health   Financial Resource Strain: Not on file  Food Insecurity: Not on file  Transportation Needs: Not on file  Physical Activity: Not on file  Stress: Not on file  Social Connections: Not on file     Family History: The patient's family history includes Basal cell carcinoma in his brother; Heart attack in his mother.  ROS:   Please see the history of present illness.     All other systems reviewed and are negative.  EKGs/Labs/Other Studies Reviewed:    The following studies were reviewed today:   EKG:  EKG is  ordered today.  The ekg ordered today demonstrates   5/8- NSR  Recent Labs: No results found for requested labs within last 365 days.   Recent Lipid Panel No results found for: "CHOL", "TRIG", "HDL", "CHOLHDL", "VLDL", "LDLCALC", "LDLDIRECT"   Risk Assessment/Calculations:  Physical Exam:    VS:   Vitals:   01/01/22 1114  BP: 118/60  Pulse: 72  SpO2: 98%     Wt Readings from Last 3 Encounters:  01/01/22 197 lb 6.4 oz (89.5 kg)  10/17/21 200 lb (90.7 kg)  09/30/21 200 lb (90.7 kg)     GEN:  Well nourished, well developed in no acute distress HEENT: Normal NECK: No JVD; No carotid bruits LYMPHATICS: No lymphadenopathy CARDIAC: RRR, no murmurs, rubs, gallops RESPIRATORY:  Clear to auscultation without rales, wheezing or rhonchi  ABDOMEN: Soft, non-tender, non-distended MUSCULOSKELETAL:  No edema; No deformity  SKIN: Warm and dry NEUROLOGIC:  Alert and oriented x 3 PSYCHIATRIC:  Normal affect   ASSESSMENT:    Elevated CAC: he has notably elevated CAC score. He denies symptoms; however his  CAC is in the 95th percentile for age. Considering this, he underwent exercise stress test which was normal. Will continue atorvastatin to 20 mg with LDL 103 mg/dL and start asa 81 mg daily.  We extensively discussed the importance of smoking cessation. LDL goal < 70 mg/dL, will get 6 week lipids  PLAN:    In order of problems listed above:   Follow up as needed  Lipid in 6 weeks       Medication Adjustments/Labs and Tests Ordered: Current medicines are reviewed at length with the patient today.  Concerns regarding medicines are outlined above.  Orders Placed This Encounter  Procedures   Lipid panel   No orders of the defined types were placed in this encounter.   Patient Instructions  Medication Instructions:  No Changes In Medications at this time.  *If you need a refill on your cardiac medications before your next appointment, please call your pharmacy*  Lab Work: Please return for FASTING Blood Work in [place] 02/12/22. No appointment needed, lab here at the office is open Monday-Friday from 8AM to 4PM and closed daily for lunch from 12:45-1:45.   If you have labs (blood work) drawn today and your tests are completely normal, you will receive your results only by: [place] (if you have MyChart) OR A paper copy in the mail If you have any lab test that is abnormal or we need to change your treatment, we will call you to review the results.  Testing/Procedures: No Changes In Medications at this time.   Follow-Up: At [facility], you and your health needs are our priority.  As part of our continuing mission to provide you with exceptional heart care, we have created designated Provider Care Teams.  These Care Teams include your primary Cardiologist (physician) and Advanced Practice Providers (APPs -  Physician Assistants and Nurse Practitioners) who all work together to provide you with the care you need, when you need it.  Your next appointment:   AS NEEDED    The format for your next appointment:   In Person  Provider:   Janina Mayo, MD     Signed, Janina Mayo, MD  01/01/2022 11:36 AM    [place]

## 2022-01-01 ENCOUNTER — Ambulatory Visit (INDEPENDENT_AMBULATORY_CARE_PROVIDER_SITE_OTHER): Payer: BC Managed Care – PPO | Admitting: Internal Medicine

## 2022-01-01 ENCOUNTER — Encounter: Payer: Self-pay | Admitting: Internal Medicine

## 2022-01-01 VITALS — BP 118/60 | HR 72 | Ht 72.0 in | Wt 197.4 lb

## 2022-01-01 DIAGNOSIS — R931 Abnormal findings on diagnostic imaging of heart and coronary circulation: Secondary | ICD-10-CM | POA: Diagnosis not present

## 2022-01-01 NOTE — Patient Instructions (Signed)
Medication Instructions:  No Changes In Medications at this time.  *If you need a refill on your cardiac medications before your next appointment, please call your pharmacy*  Lab Work: Please return for FASTING Blood Work in [place] 02/12/22. No appointment needed, lab here at the office is open Monday-Friday from 8AM to 4PM and closed daily for lunch from 12:45-1:45.   If you have labs (blood work) drawn today and your tests are completely normal, you will receive your results only by: [place] (if you have MyChart) OR A paper copy in the mail If you have any lab test that is abnormal or we need to change your treatment, we will call you to review the results.  Testing/Procedures: No Changes In Medications at this time.   Follow-Up: At [facility], you and your health needs are our priority.  As part of our continuing mission to provide you with exceptional heart care, we have created designated Provider Care Teams.  These Care Teams include your primary Cardiologist (physician) and Advanced Practice Providers (APPs -  Physician Assistants and Nurse Practitioners) who all work together to provide you with the care you need, when you need it.  Your next appointment:   AS NEEDED   The format for your next appointment:   In Person  Provider:   Janina Mayo, MD

## 2022-07-01 ENCOUNTER — Ambulatory Visit: Payer: Self-pay

## 2022-07-01 ENCOUNTER — Encounter: Payer: Self-pay | Admitting: Family Medicine

## 2022-07-01 ENCOUNTER — Ambulatory Visit: Payer: BC Managed Care – PPO | Admitting: Family Medicine

## 2022-07-01 ENCOUNTER — Ambulatory Visit (INDEPENDENT_AMBULATORY_CARE_PROVIDER_SITE_OTHER): Payer: BC Managed Care – PPO

## 2022-07-01 VITALS — BP 110/64 | HR 74 | Ht 72.0 in | Wt 204.0 lb

## 2022-07-01 DIAGNOSIS — S83281A Other tear of lateral meniscus, current injury, right knee, initial encounter: Secondary | ICD-10-CM

## 2022-07-01 DIAGNOSIS — G8929 Other chronic pain: Secondary | ICD-10-CM

## 2022-07-01 DIAGNOSIS — M2550 Pain in unspecified joint: Secondary | ICD-10-CM | POA: Diagnosis not present

## 2022-07-01 DIAGNOSIS — M25561 Pain in right knee: Secondary | ICD-10-CM

## 2022-07-01 DIAGNOSIS — M1711 Unilateral primary osteoarthritis, right knee: Secondary | ICD-10-CM | POA: Diagnosis not present

## 2022-07-01 NOTE — Progress Notes (Signed)
[name] [address] [place] Phone: [phone] Subjective:   IVilma Meckel, am serving as a scribe for Dr. Hulan Saas.  I'm seeing this patient by the request  of:  Koirala, Dibas, MD  CC: Right knee pain  [id number]  Corey Lynch is a 62 y.o. male coming in with complaint of bilateral knee pain. Hasn't been seen since 2021. Plays a lot of basketball. Pain over superior and medial aspect of knees. Deep knee bending makes pain worse. Pain also wakes him up at night.        No past medical history on file. No past surgical history on file. Social History   Socioeconomic History   Marital status: Married    Spouse name: Not on file   Number of children: Not on file   Years of education: Not on file   Highest education level: Not on file  Occupational History   Not on file  Tobacco Use   Smoking status: Every Day    Packs/day: 0.25    Types: Cigarettes   Smokeless tobacco: Current   Tobacco comments:    smoking 1/3 pack daily 06/04/2020  Substance and Sexual Activity   Alcohol use: Yes   Drug use: Never   Sexual activity: Not on file  Other Topics Concern   Not on file  Social History Narrative   Not on file   Social Determinants of Health   Financial Resource Strain: Not on file  Food Insecurity: Not on file  Transportation Needs: Not on file  Physical Activity: Not on file  Stress: Not on file  Social Connections: Not on file   No Known Allergies Family History  Problem Relation Age of Onset   Basal cell carcinoma Brother    Heart attack Mother      Current Outpatient Medications (Cardiovascular):    atorvastatin (LIPITOR) 20 MG tablet, 1 tablet     Current Outpatient Medications (Other):    varenicline (CHANTIX) 1 MG tablet, 1/2 pill once day for 3 days, then 1/2 pill twice a day for 4 days, then 1 pill twice a day thereafter   Reviewed prior external information including notes and  imaging from  primary care provider As well as notes that were available from care everywhere and other healthcare systems.  Past medical history, social, surgical and family history all reviewed in electronic medical record.  No pertanent information unless stated regarding to the chief complaint.   Review of Systems:  No headache, visual changes, nausea, vomiting, diarrhea, constipation, dizziness, abdominal pain, skin rash, fevers, chills, night sweats, weight loss, swollen lymph nodes, body aches, joint swelling, chest pain, shortness of breath, mood changes. POSITIVE muscle aches  Objective  Blood pressure 110/64, pulse 74, height 6' (1.829 m), weight 204 lb (92.5 kg), SpO2 98 %.   General: No apparent distress alert and oriented x3 mood and affect normal, dressed appropriately.  HEENT: Pupils equal, extraocular movements intact  Respiratory: Patient's speak in full sentences and does not appear short of breath  Cardiovascular: No lower extremity edema, non tender, no erythema  Right knee exam shows the patient does have some significant effusion noted.  Mild antalgic gait noted.  Limited range of motion.  Positive McMurray's noted.  ACL appears to be intact but difficult to assess secondary to patient's tightness and swelling of the knee.  Procedure: Real-time Ultrasound Guided Injection of right knee Device: GE Logiq Q7 Ultrasound guided injection is preferred based  studies that show increased duration, increased effect, greater accuracy, decreased procedural pain, increased response rate, and decreased cost with ultrasound guided versus blind injection.  Verbal informed consent obtained.  Time-out conducted.  Noted no overlying erythema, induration, or other signs of local infection.  Skin prepped in a sterile fashion.  Local anesthesia: Topical Ethyl chloride.  With sterile technique and under real time ultrasound guidance: With a 22-gauge 2 inch needle patient was injected with 4  cc of 0.5% Marcaine and aspirated 65 cc of straw light-colored fluid and then injected 1 cc of Kenalog 40 mg/dL. This was from a superior lateral approach.  Completed without difficulty  Pain immediately resolved suggesting accurate placement of the medication.  Advised to call if fevers/chills, erythema, induration, drainage, or persistent bleeding.  Impression: Technically successful ultrasound guided injection.    Impression and Recommendations:     The above documentation has been reviewed and is accurate and complete Lyndal Pulley, DO

## 2022-07-01 NOTE — Assessment & Plan Note (Signed)
Patient has aspiration done today.  Tolerated the procedure well with improvement in range of motion almost immediately.  Will need to monitor the lateral meniscus noted.  Does seem to be a peripheral tear.  Discussed with patient about icing regimen, home exercises, which activities to do and which ones to avoid.  Follow-up with me again in 6 to 8 weeks x-rays pending.

## 2022-07-01 NOTE — Patient Instructions (Signed)
Xray  Exercises Aspirated R knee today See me in 5-6 weeks

## 2022-07-02 LAB — SYNOVIAL FLUID ANALYSIS, COMPLETE
Basophils, %: 0 %
Eosinophils-Synovial: 0 % (ref 0–2)
Lymphocytes-Synovial Fld: 20 % (ref 0–74)
Monocyte/Macrophage: 70 % — ABNORMAL HIGH (ref 0–69)
Neutrophil, Synovial: 10 % (ref 0–24)
Synoviocytes, %: 0 % (ref 0–15)
WBC, Synovial: 281 cells/uL — ABNORMAL HIGH (ref <150)

## 2022-07-03 ENCOUNTER — Ambulatory Visit: Payer: BC Managed Care – PPO | Admitting: Family Medicine

## 2022-07-14 ENCOUNTER — Ambulatory Visit: Payer: BC Managed Care – PPO | Admitting: Sports Medicine

## 2022-07-14 ENCOUNTER — Ambulatory Visit (INDEPENDENT_AMBULATORY_CARE_PROVIDER_SITE_OTHER): Payer: BC Managed Care – PPO

## 2022-07-14 ENCOUNTER — Ambulatory Visit: Payer: Self-pay

## 2022-07-14 VITALS — BP 120/80 | HR 61 | Ht 72.0 in | Wt 204.0 lb

## 2022-07-14 DIAGNOSIS — S4991XA Unspecified injury of right shoulder and upper arm, initial encounter: Secondary | ICD-10-CM | POA: Diagnosis not present

## 2022-07-14 DIAGNOSIS — M19011 Primary osteoarthritis, right shoulder: Secondary | ICD-10-CM | POA: Diagnosis not present

## 2022-07-14 DIAGNOSIS — M25511 Pain in right shoulder: Secondary | ICD-10-CM

## 2022-07-14 NOTE — Progress Notes (Signed)
Corey Lynch D.[place] Phone: [phone]   Assessment and Plan:     1. Acute pain of right shoulder 2. Arthritis of right acromioclavicular joint 3. Pain of right sternoclavicular joint  -Chronic with exacerbation, initial sports medicine visit - Most consistent with [place] joint sprain from fall onto right shoulder with history of chronic AC joint dislocation - X-ray obtained in clinic.  My interpretation: No acute fracture.  Appearance of AC joint dislocation with clavicle superior to the acromion.  Patient states that he had a history of AC joint dislocation 30+ years ago without surgical correction.  I suspect this is chronic in nature.  Will await formal radiology review. - May continue physical activity as tolerated.  Recommend decreasing upper extremity activities over the next 1 to 2 weeks with gradual reintroduction - May use Tylenol/NSAIDs as needed - Start HEP for shoulder  Sports Medicine: Musculoskeletal Ultrasound. Exam: Limited US of right clavicle Diagnosis: Right [place] joint pain and clavicular pain  US Findings: Distal clavicle appears to be superior to acromion at [facility] joint.  No acute fracture or significant hypoechoic pools representing edema.  No cortical disruption along clavicle.  TTP right [place] joint without laxity or edema at [facility] joint.  No dislocation apparent at [facility] joint  US Impression:  [place] joint sprain, right   Pertinent previous records reviewed include none   Follow Up: As needed if no improvement or worsening of symptoms.  Could consider prolonged upper extremity inactivity to allow for rest.  Could consider NSAID course   Subjective:   I, Corey Lynch, am serving as a Education administrator for Doctor Glennon Mac  Chief Complaint: right shoulder pain   HPI:   07/14/22 Patient is a 62 year old male complaining of right shoulder pain. Patient states that he fell last Wednesday skiing ,  was able to finish skiing, decreased ROM, isnt able to get comf at night , TTP clavicle and shoulder has a knot , hx of separated collar bone, no numbness or tingling, no meds for the pain, states he is just sore has pain he sneezes he moves wrong, has a basketball game tonight , isnt able to throw with any velocity    Relevant Historical Information: History of AC joint separation of right shoulder 30+ years ago  Additional pertinent review of systems negative.   Current Outpatient Medications:    atorvastatin (LIPITOR) 20 MG tablet, 1 tablet, Disp: , Rfl:    varenicline (CHANTIX) 1 MG tablet, 1/2 pill once day for 3 days, then 1/2 pill twice a day for 4 days, then 1 pill twice a day thereafter, Disp: , Rfl:    Objective:     Vitals:   07/14/22 0959  BP: 120/80  Pulse: 61  SpO2: 98%  Weight: 204 lb (92.5 kg)  Height: 6' (1.829 m)      Body mass index is 27.67 kg/m.    Physical Exam:    Gen: Appears well, nad, nontoxic and pleasant Neuro:sensation intact, strength is 5/5 with df/pf/inv/ev, muscle tone wnl Skin: no suspicious lesion or defmority Psych: A&O, appropriate mood and affect  Right shoulder:  No   swelling or muscle wasting Protrusion of clavicle at right [facility] joint without TTP TTP [place] on right and proximal third clavicle No scapular winging FF 180, abd 180, int 0, ext 90 NTTP over the  ac, coracoid, biceps groove, humerus, deltoid, trapezius, cervical spine   FROM of  neck    Electronically signed by:  Corey Lynch D.[facility] 10:37 AM 07/14/22

## 2022-07-14 NOTE — Patient Instructions (Addendum)
Good to see you  Shoulder HEP  As needed follow up

## 2022-07-15 IMAGING — CT CT CARDIAC CORONARY ARTERY CALCIUM SCORE
2 series · 15 of 20 positions shown, 17 images · non-contrast
Comparison: None.
COMPARISON: None.

Addendum:
EXAM:
OVER-READ INTERPRETATION  CT CHEST

The following report is an over-read performed by radiologist Dr.
Ye Proctor [REDACTED] on 08/31/2021. This
over-read does not include interpretation of cardiac or coronary
anatomy or pathology. The coronary calcium score interpretation by
the cardiologist is attached.
CLINICAL DATA: Cardiovascular Disease Risk stratification
Coronary Calcium Score
TECHNIQUE: A gated, non-contrast computed tomography scan of the heart was
performed using 3mm slice thickness. Axial images were analyzed on a
dedicated workstation. Calcium scoring of the coronary arteries was
performed using the Agatston method.

[Series 3: cascseq 2.0 b35f 70% · axial · 0.39mm/px · z∈[+1090,+1204]mm · 7 of 86 slices shown]
[im 10/86  vessel]
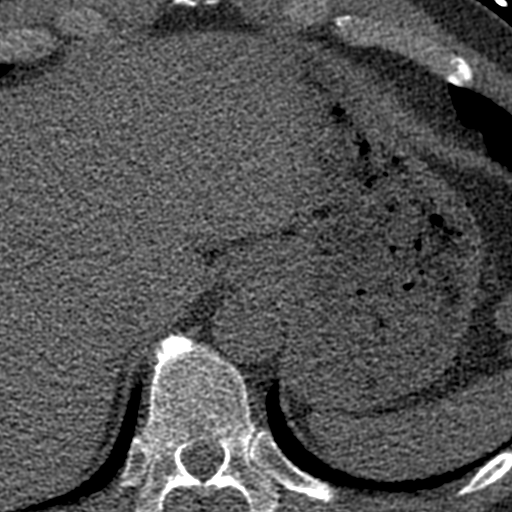
[im 19/86  vessel]
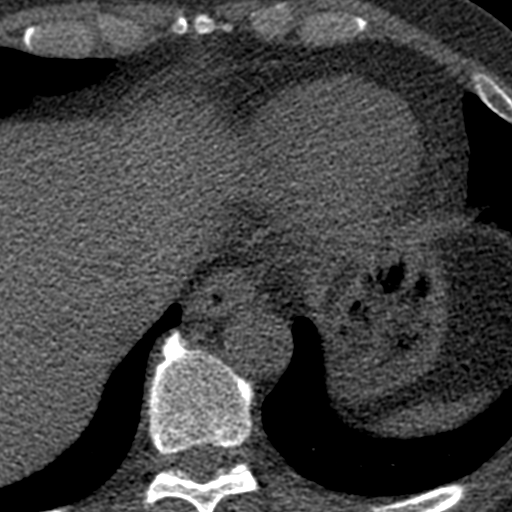
[im 29/86  vessel]
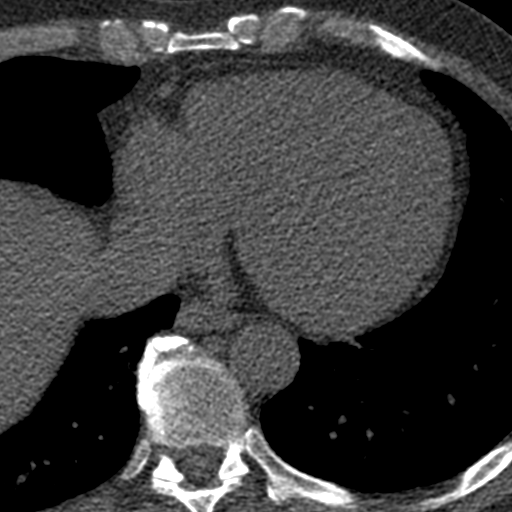
[im 38/86  vessel]
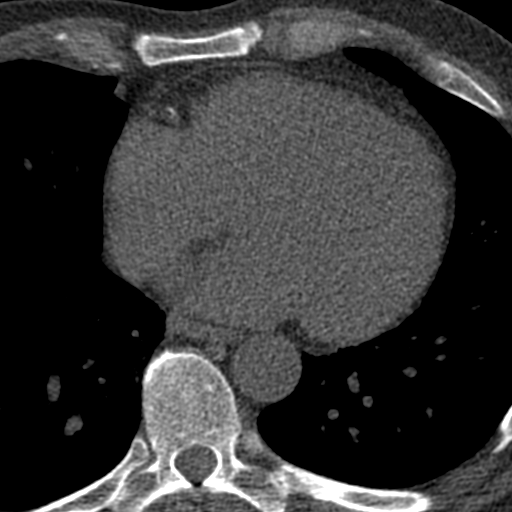
[im 48/86  vessel]
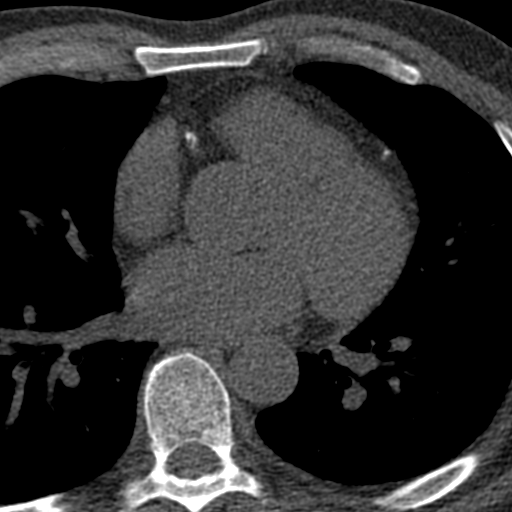
[im 57/86  vessel]
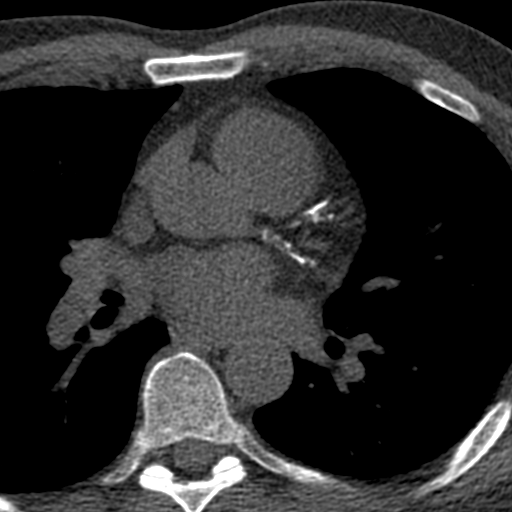
[im 67/86  vessel]
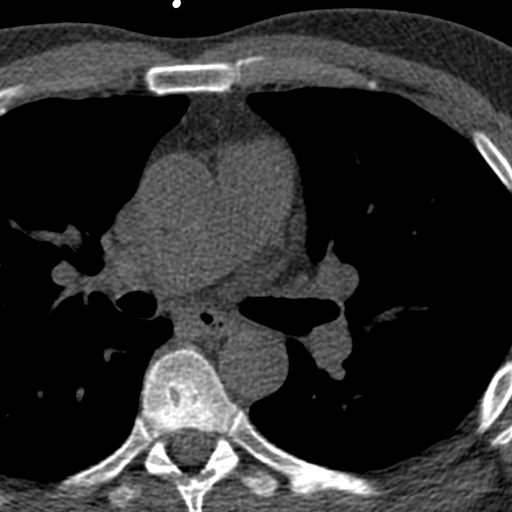

[Series 4: ax st full fov · axial · 0.77mm/px · z∈[+1090,+1222]mm · 8 of 86 slices shown, 10 images]
[im 10/86  vessel]
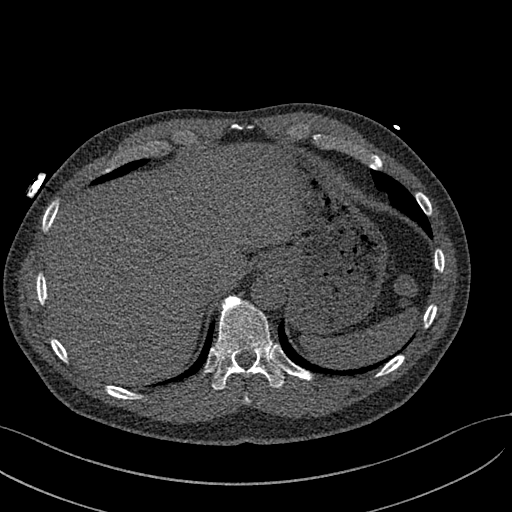
[im 10/86  lung]
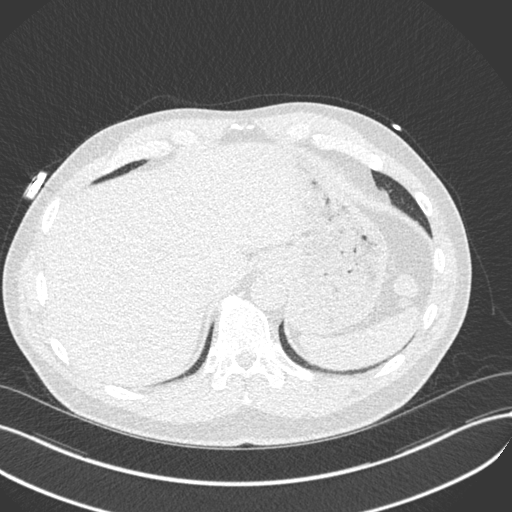
[im 19/86  vessel]
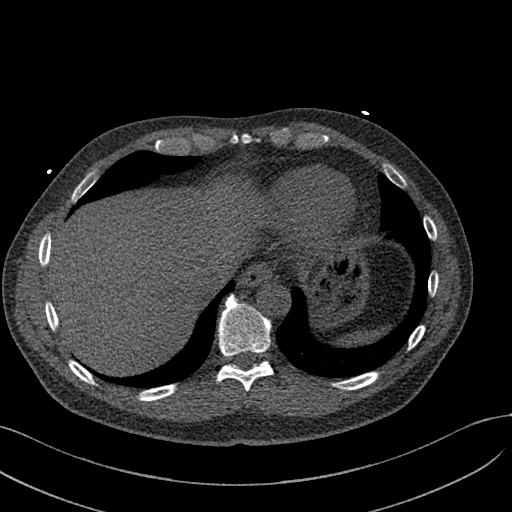
[im 29/86  vessel]
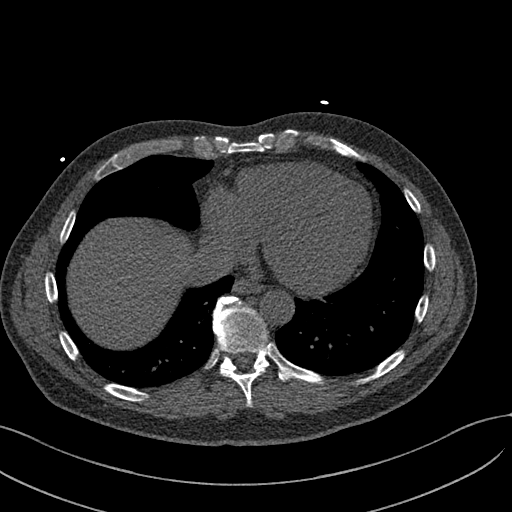
[im 38/86  vessel]
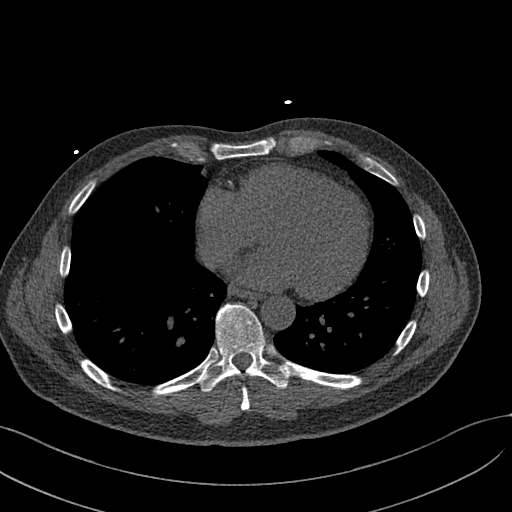
[im 48/86  vessel]
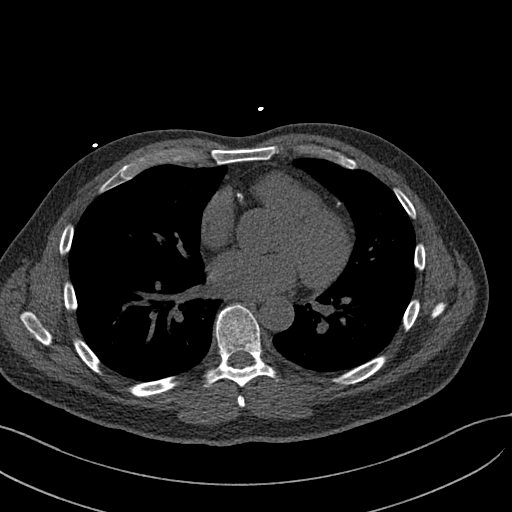
[im 48/86  lung]
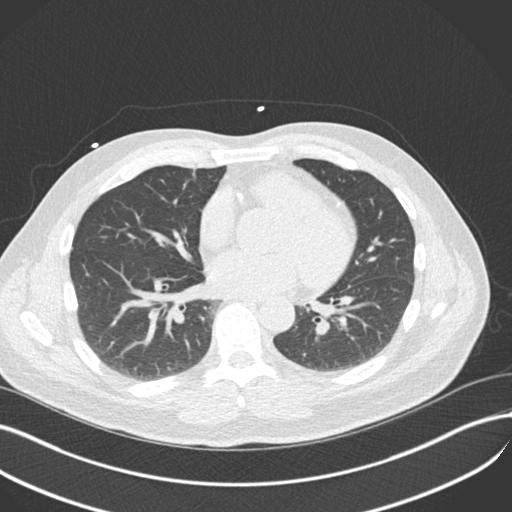
[im 57/86  vessel]
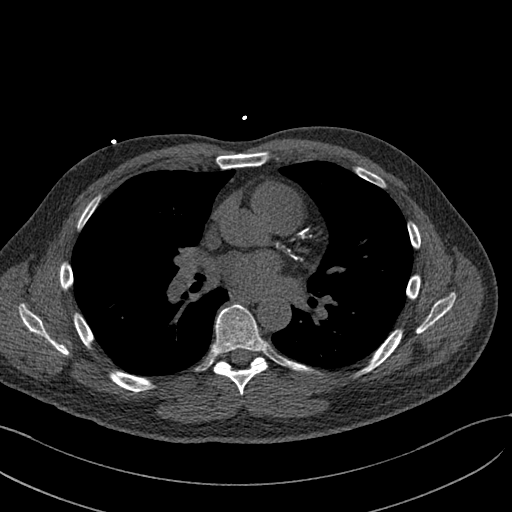
[im 67/86  vessel]
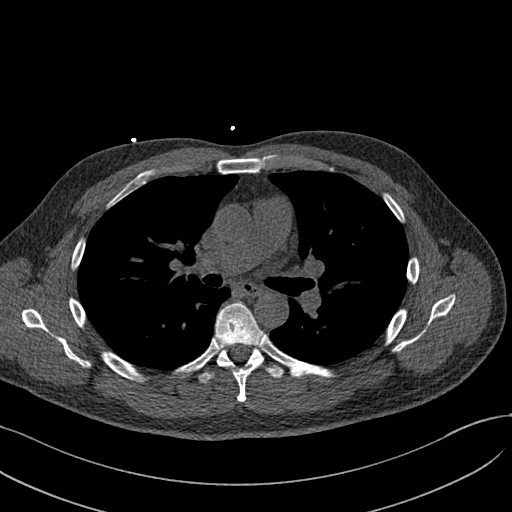
[im 76/86  vessel]
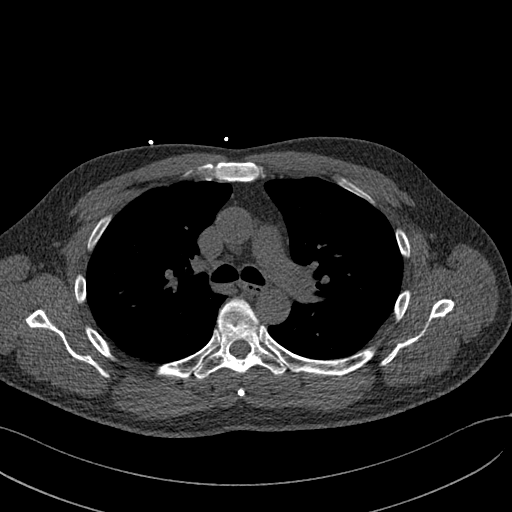

[15 of 20 positions shown; findings below may reference images not displayed]

FINDINGS: Atherosclerotic calcifications in the thoracic aorta. Within the
visualized portions of the thorax there are no suspicious appearing
pulmonary nodules or masses, there is no acute consolidative
airspace disease, no pleural effusions, no pneumothorax and no
lymphadenopathy. Visualized portions of the upper abdomen are
unremarkable. There are no aggressive appearing lytic or blastic
lesions noted in the visualized portions of the skeleton.
IMPRESSION: 1.  Aortic Atherosclerosis (EQ4NT-7KU.U).
FINDINGS: Coronary Calcium Score:

Left main: 0

Left anterior descending artery: 394

Left circumflex artery: 164

Right coronary artery: 118

Total: 676

Percentile: 95th

Pericardium: Normal.

Non-cardiac: See separate report from [REDACTED].
IMPRESSION: Coronary calcium score of 676. This was 95th percentile for age-,
race-, and sex-matched controls.



If CAC=0, it is reasonable to withhold statin therapy and reassess
in 5 to 10 years, as long as higher risk conditions are absent
(diabetes mellitus, family history of premature CHD in first degree
relatives (males <55 years; females <65 years), cigarette smoking,
or LDL >=190 mg/dL).

If CAC is 1 to 99, it is reasonable to initiate statin therapy for
patients >=55 years of age.

If CAC is >=100 or >=75th percentile, it is reasonable to initiate
statin therapy at any age.

Cardiology referral should be considered for patients with CAC
scores >=400 or >=75th percentile.

*3844 AHA/ACC/AACVPR/AAPA/ABC/UNIKATNI BOZICNI/SCHUBERT/SEFIJA/[place]/JIM/ADELFO/M ROED
Guideline on the Management of Blood Cholesterol: A Report of the
American College of Cardiology/American Heart Association Task Force
on Clinical Practice Guidelines. J Am Coll Cardiol.
2849;73(24):2264-2061.

*** End of Addendum ***
EXAM:
OVER-READ INTERPRETATION  CT CHEST

The following report is an over-read performed by radiologist Dr.
Ye Proctor [REDACTED] on 08/31/2021. This
over-read does not include interpretation of cardiac or coronary
anatomy or pathology. The coronary calcium score interpretation by
the cardiologist is attached.
FINDINGS: Atherosclerotic calcifications in the thoracic aorta. Within the
visualized portions of the thorax there are no suspicious appearing
pulmonary nodules or masses, there is no acute consolidative
airspace disease, no pleural effusions, no pneumothorax and no
lymphadenopathy. Visualized portions of the upper abdomen are
unremarkable. There are no aggressive appearing lytic or blastic
lesions noted in the visualized portions of the skeleton.
IMPRESSION: 1.  Aortic Atherosclerosis (EQ4NT-7KU.U).

## 2022-07-18 ENCOUNTER — Ambulatory Visit: Payer: BC Managed Care – PPO | Admitting: Family Medicine

## 2022-07-18 ENCOUNTER — Ambulatory Visit: Payer: Self-pay

## 2022-07-18 VITALS — BP 118/82 | HR 91 | Ht 72.0 in | Wt 204.0 lb

## 2022-07-18 DIAGNOSIS — G8929 Other chronic pain: Secondary | ICD-10-CM | POA: Diagnosis not present

## 2022-07-18 DIAGNOSIS — S83281A Other tear of lateral meniscus, current injury, right knee, initial encounter: Secondary | ICD-10-CM

## 2022-07-18 DIAGNOSIS — M25561 Pain in right knee: Secondary | ICD-10-CM

## 2022-07-18 NOTE — Progress Notes (Signed)
Corey Lynch [address] [place] [address] Phone: [phone] Subjective:   Corey Lynch, am serving as a scribe for Dr. Antoine Primas.  I'm seeing this patient by the request  of:  Koirala, Dibas, MD  CC: Right knee swelling  [id number]  Corey Lynch is a 62 y.o. male coming in with complaint of right knee pain.  Patient was seen previously 3 weeks ago and was diagnosed with an acute lateral meniscal tear with effusion.  Aspiration of the knee done on February 6.  Patient was to do home exercises.  Patient states he played basketball on Monday. On Tuesday and Wednesday he noticed his knee was swollen. Swelling has subsided and pain is less today. Monday 2/26 is last game.       No past medical history on file. No past surgical history on file. Social History   Socioeconomic History   Marital status: Married    Spouse name: Not on file   Number of children: Not on file   Years of education: Not on file   Highest education level: Not on file  Occupational History   Not on file  Tobacco Use   Smoking status: Every Day    Packs/day: 0.25    Types: Cigarettes   Smokeless tobacco: Current   Tobacco comments:    smoking 1/3 pack daily 06/04/2020  Substance and Sexual Activity   Alcohol use: Yes   Drug use: Never   Sexual activity: Not on file  Other Topics Concern   Not on file  Social History Narrative   Not on file   Social Determinants of Health   Financial Resource Strain: Not on file  Food Insecurity: Not on file  Transportation Needs: Not on file  Physical Activity: Not on file  Stress: Not on file  Social Connections: Not on file   No Known Allergies Family History  Problem Relation Age of Onset   Basal cell carcinoma Brother    Heart attack Mother      Current Outpatient Medications (Cardiovascular):    atorvastatin (LIPITOR) 20 MG tablet, 1 tablet     Current Outpatient Medications (Other):     varenicline (CHANTIX) 1 MG tablet, 1/2 pill once day for 3 days, then 1/2 pill twice a day for 4 days, then 1 pill twice a day thereafter   Reviewed prior external information including notes and imaging from  primary care provider As well as notes that were available from care everywhere and other healthcare systems.  Past medical history, social, surgical and family history all reviewed in electronic medical record.  No pertanent information unless stated regarding to the chief complaint.   Review of Systems:  No headache, visual changes, nausea, vomiting, diarrhea, constipation, dizziness, abdominal pain, skin rash, fevers, chills, night sweats, weight loss, swollen lymph nodes, body aches, joint swelling, chest pain, shortness of breath, mood changes. POSITIVE muscle aches  Objective  Blood pressure 118/82, pulse 91, height 6' (1.829 m), weight 204 lb (92.5 kg), SpO2 97 %.   General: No apparent distress alert and oriented x3 mood and affect normal, dressed appropriately.  HEENT: Pupils equal, extraocular movements intact  Respiratory: Patient's speak in full sentences and does not appear short of breath  Cardiovascular: No lower extremity edema, non tender, no erythema  Patient does have an antalgic gait noted.  Significant effusion of the right knee noted.  Positive impingement noted.  Procedure: Real-time Ultrasound Guided Injection of right knee Device: GE  Logiq Q7 Ultrasound guided injection is preferred based studies that show increased duration, increased effect, greater accuracy, decreased procedural pain, increased response rate, and decreased cost with ultrasound guided versus blind injection.  Verbal informed consent obtained.  Time-out conducted.  Noted no overlying erythema, induration, or other signs of local infection.  Skin prepped in a sterile fashion.  Local anesthesia: Topical Ethyl chloride.  With sterile technique and under real time ultrasound guidance: With an  18-gauge 1-1/2 inch needle patient was injected with 2 cc of 0.5% Marcaine and aspirated 55 cc of straw-colored fluid.  Injected 1 cc of Kenalog 40 mg/mL Completed without difficulty  Pain immediately resolved suggesting accurate placement of the medication.  Advised to call if fevers/chills, erythema, induration, drainage, or persistent bleeding.  Impression: Technically successful ultrasound guided injection.    Impression and Recommendations:    The above documentation has been reviewed and is accurate and complete Judi Saa, DO

## 2022-07-20 ENCOUNTER — Encounter: Payer: Self-pay | Admitting: Family Medicine

## 2022-07-20 NOTE — Assessment & Plan Note (Signed)
Repeat aspiration done again today.  Significant amount of fluid within the area again.  Discussed with patient that if this comes back area reaccumulate's again quickly we do need to consider advanced imaging.  Is affecting daily activities at this moment.  Social determinants of health includes tobacco use.  This could still be healing.  Follow-up with me again in 3 to 4 weeks

## 2022-08-04 ENCOUNTER — Ambulatory Visit
Admission: RE | Admit: 2022-08-04 | Discharge: 2022-08-04 | Disposition: A | Discharge status: Home / Self Care | Payer: BC Managed Care – PPO | Source: Ambulatory Visit | Attending: Family Medicine | Admitting: Family Medicine

## 2022-08-04 DIAGNOSIS — F17210 Nicotine dependence, cigarettes, uncomplicated: Secondary | ICD-10-CM

## 2022-08-04 NOTE — Progress Notes (Unsigned)
[place] Phone: [phone] Subjective:   Corey Lynch, am serving as a scribe for Dr. Hulan Saas. I'm seeing this patient by the request  of:  Koirala, Dibas, MD  CC: Right knee pain  [id number]  07/18/2022 Repeat aspiration done again today. Significant amount of fluid within the area again. Discussed with patient that if this comes back area reaccumulate's again quickly we do need to consider advanced imaging. Is affecting daily activities at this moment. Social determinants of health includes tobacco use. This could still be healing. Follow-up with me again in 3 to 4 weeks   Updated 08/05/2022 Corey Lynch is a 62 y.o. male coming in with complaint of R knee pain. Patient states that he played basketball 2 weeks ago and his knee swelled back up after playing.      Lynch past medical history on file. Lynch past surgical history on file. Social History   Socioeconomic History   Marital status: Married    Spouse name: Not on file   Number of children: Not on file   Years of education: Not on file   Highest education level: Not on file  Occupational History   Not on file  Tobacco Use   Smoking status: Every Day    Packs/day: 0.25    Types: Cigarettes   Smokeless tobacco: Current   Tobacco comments:    smoking 1/3 pack daily 06/04/2020  Substance and Sexual Activity   Alcohol use: Yes   Drug use: Never   Sexual activity: Not on file  Other Topics Concern   Not on file  Social History Narrative   Not on file   Social Determinants of Health   Financial Resource Strain: Not on file  Food Insecurity: Not on file  Transportation Needs: Not on file  Physical Activity: Not on file  Stress: Not on file  Social Connections: Not on file   Lynch Known Allergies Family History  Problem Relation Age of Onset   Basal cell carcinoma Brother    Heart attack Mother      Current Outpatient Medications  (Cardiovascular):    atorvastatin (LIPITOR) 20 MG tablet, 1 tablet       Reviewed prior external information including notes and imaging from  primary care provider As well as notes that were available from care everywhere and other healthcare systems.  Past medical history, social, surgical and family history all reviewed in electronic medical record.  Lynch pertanent information unless stated regarding to the chief complaint.   Review of Systems:  Lynch headache, visual changes, nausea, vomiting, diarrhea, constipation, dizziness, abdominal pain, skin rash, fevers, chills, night sweats, weight loss, swollen lymph nodes, body aches, chest pain, shortness of breath, mood changes. POSITIVE muscle aches, joint swelling  Objective  Blood pressure 128/72, pulse 71, height 6' (1.829 m), weight 203 lb (92.1 kg), SpO2 96 %.   General: Lynch apparent distress alert and oriented x3 mood and affect normal, dressed appropriately.  HEENT: Pupils equal, extraocular movements intact  Significant swelling of the right knee still noted at this time.  Does have effusion noted.  Does have crepitus noted.  Positive McMurray's noted.  Limited muscular skeletal ultrasound was performed and interpreted by Hulan Saas, M  Ultrasound does show hypoechoic changes within the significant synovitis still noted at this time.  Patient still has the abnormality noted of the lateral meniscus with some displacement noted. Impression: Worsening effusion and lateral meniscal tear  with instability of the    Impression and Recommendations:     The above documentation has been reviewed and is accurate and complete Lyndal Pulley, DO

## 2022-08-05 ENCOUNTER — Ambulatory Visit: Payer: Self-pay

## 2022-08-05 ENCOUNTER — Encounter: Payer: Self-pay | Admitting: Family Medicine

## 2022-08-05 ENCOUNTER — Other Ambulatory Visit: Payer: Self-pay

## 2022-08-05 ENCOUNTER — Ambulatory Visit: Payer: BC Managed Care – PPO | Admitting: Family Medicine

## 2022-08-05 VITALS — BP 128/72 | HR 71 | Ht 72.0 in | Wt 203.0 lb

## 2022-08-05 DIAGNOSIS — S83281A Other tear of lateral meniscus, current injury, right knee, initial encounter: Secondary | ICD-10-CM | POA: Diagnosis not present

## 2022-08-05 DIAGNOSIS — G8929 Other chronic pain: Secondary | ICD-10-CM | POA: Diagnosis not present

## 2022-08-05 DIAGNOSIS — M25561 Pain in right knee: Secondary | ICD-10-CM | POA: Diagnosis not present

## 2022-08-05 DIAGNOSIS — Z87891 Personal history of nicotine dependence: Secondary | ICD-10-CM

## 2022-08-05 DIAGNOSIS — F17210 Nicotine dependence, cigarettes, uncomplicated: Secondary | ICD-10-CM

## 2022-08-05 NOTE — Assessment & Plan Note (Signed)
Continue effusion noted, patient is having instability of the knee.  Concerned that the meniscal tear could be potentially worsened within what we would be anticipating.  Discussed with patient about icing regimen and home exercises otherwise.  Failed steroid injection and is affecting daily activities.  At this point I do feel that advanced imaging is warranted.  Will get MRI to further evaluate.  Depending on findings we will discuss which activities to do

## 2022-08-05 NOTE — Patient Instructions (Signed)
MRI R knee [phone] We will be in touch 

## 2022-08-12 ENCOUNTER — Ambulatory Visit
Admission: RE | Admit: 2022-08-12 | Discharge: 2022-08-12 | Disposition: A | Discharge status: Home / Self Care | Payer: BC Managed Care – PPO | Source: Ambulatory Visit | Attending: Family Medicine | Admitting: Family Medicine

## 2022-08-12 DIAGNOSIS — M25561 Pain in right knee: Secondary | ICD-10-CM | POA: Diagnosis not present

## 2022-08-12 DIAGNOSIS — G8929 Other chronic pain: Secondary | ICD-10-CM

## 2022-08-13 NOTE — Progress Notes (Unsigned)
[place] Phone: [phone] Subjective:   [name], am serving as a scribe for Dr. Hulan Saas.  I'm seeing this patient by the request  of:  Koirala, Dibas, MD  CC: Right knee pain  [id number]  08/05/2022 Continue effusion noted, patient is having instability of the knee. Concerned that the meniscal tear could be potentially worsened within what we would be anticipating. Discussed with patient about icing regimen and home exercises otherwise. Failed steroid injection and is affecting daily activities. At this point I do feel that advanced imaging is warranted. Will get MRI to further evaluate. Depending on findings we will discuss which activities to do   Update 08/13/2021 Corey Lynch is a 62 y.o. male coming in with complaint of R knee pain. Patient states that he is here to talk about MRI results.  MRI results are not back at this time.  Would like knee drained today.  Is having more pain.  Feels like it swollen that is fairly significant and stopping him from daily activities.       No past medical history on file. No past surgical history on file. Social History   Socioeconomic History   Marital status: Married    Spouse name: Not on file   Number of children: Not on file   Years of education: Not on file   Highest education level: Not on file  Occupational History   Not on file  Tobacco Use   Smoking status: Every Day    Packs/day: .25    Types: Cigarettes   Smokeless tobacco: Current   Tobacco comments:    smoking 1/3 pack daily 06/04/2020  Substance and Sexual Activity   Alcohol use: Yes   Drug use: Never   Sexual activity: Not on file  Other Topics Concern   Not on file  Social History Narrative   Not on file   Social Determinants of Health   Financial Resource Strain: Not on file  Food Insecurity: Not on file  Transportation Needs: Not on file  Physical Activity: Not  on file  Stress: Not on file  Social Connections: Not on file   No Known Allergies Family History  Problem Relation Age of Onset   Basal cell carcinoma Brother    Heart attack Mother      Current Outpatient Medications (Cardiovascular):    atorvastatin (LIPITOR) 20 MG tablet, 1 tablet       Reviewed prior external information including notes and imaging from  primary care provider As well as notes that were available from care everywhere and other healthcare systems.  Past medical history, social, surgical and family history all reviewed in electronic medical record.  No pertanent information unless stated regarding to the chief complaint.   Review of Systems:  No headache, visual changes, nausea, vomiting, diarrhea, constipation, dizziness, abdominal pain, skin rash, fevers, chills, night sweats, weight loss, swollen lymph nodes, body aches, joint swelling, chest pain, shortness of breath, mood changes. POSITIVE muscle aches  Objective  Blood pressure 118/78, pulse 60, height 6' (1.829 m), weight 207 lb (93.9 kg), SpO2 97 %.   General: No apparent distress alert and oriented x3 mood and affect normal, dressed appropriately.  HEENT: Pupils equal, extraocular movements intact  Respiratory: Patient's speak in full sentences and does not appear short of breath  Cardiovascular: No lower extremity edema, non tender, no erythema  Antalgic gait favoring the right knee at the moment.  Procedure: Real-time Ultrasound Guided Injection of right knee Device: GE Logiq Q7 Ultrasound guided injection is preferred based studies that show increased duration, increased effect, greater accuracy, decreased procedural pain, increased response rate, and decreased cost with ultrasound guided versus blind injection.  Verbal informed consent obtained.  Time-out conducted.  Noted no overlying erythema, induration, or other signs of local infection.  Skin prepped in a sterile fashion.  Local  anesthesia: Topical Ethyl chloride.  With sterile technique and under real time ultrasound guidance: With a 22-gauge 2 inch needle patient was injected with 4 cc of 0.5% Marcaine and aspirated 85 cc of straw-colored fluid then injected 1 cc of Kenalog 40 mg/dL. This was from a superior lateral approach.  Completed without difficulty  Pain immediately resolved suggesting accurate placement of the medication.  Advised to call if fevers/chills, erythema, induration, drainage, or persistent bleeding.  Impression: Technically successful ultrasound guided injection.    Impression and Recommendations:    The above documentation has been reviewed and is accurate and complete Lyndal Pulley, DO

## 2022-08-14 ENCOUNTER — Encounter: Payer: Self-pay | Admitting: Family Medicine

## 2022-08-14 ENCOUNTER — Other Ambulatory Visit: Payer: Self-pay

## 2022-08-14 ENCOUNTER — Ambulatory Visit: Payer: BC Managed Care – PPO | Admitting: Family Medicine

## 2022-08-14 VITALS — BP 118/78 | HR 60 | Ht 72.0 in | Wt 207.0 lb

## 2022-08-14 DIAGNOSIS — M25561 Pain in right knee: Secondary | ICD-10-CM

## 2022-08-14 DIAGNOSIS — S83281A Other tear of lateral meniscus, current injury, right knee, initial encounter: Secondary | ICD-10-CM

## 2022-08-14 DIAGNOSIS — G8929 Other chronic pain: Secondary | ICD-10-CM

## 2022-08-14 NOTE — Assessment & Plan Note (Signed)
Unfortunately I am unable to see the imaging at this time.  Hoping that they will be dropped into the PACS unit in the near future.  We are going to call to the imaging facility and talk.  Discussed which activities to do and which ones to avoid.  Discussed compression.  I do feel that there will be a lateral meniscus and talk about the next steps.  Hopefully the injection does keep some of the fluid from reaccumulating quickly.  Follow-up again in 6 to 8 weeks

## 2022-08-14 NOTE — Patient Instructions (Addendum)
Drained the knee today See me again in

## 2022-08-18 ENCOUNTER — Other Ambulatory Visit: Payer: Self-pay

## 2022-08-18 DIAGNOSIS — M25561 Pain in right knee: Secondary | ICD-10-CM

## 2022-08-22 DIAGNOSIS — L538 Other specified erythematous conditions: Secondary | ICD-10-CM | POA: Diagnosis not present

## 2022-08-22 DIAGNOSIS — L298 Other pruritus: Secondary | ICD-10-CM | POA: Diagnosis not present

## 2022-08-22 DIAGNOSIS — L820 Inflamed seborrheic keratosis: Secondary | ICD-10-CM | POA: Diagnosis not present

## 2022-08-22 DIAGNOSIS — Z789 Other specified health status: Secondary | ICD-10-CM | POA: Diagnosis not present

## 2022-08-22 DIAGNOSIS — L821 Other seborrheic keratosis: Secondary | ICD-10-CM | POA: Diagnosis not present

## 2022-08-22 DIAGNOSIS — D225 Melanocytic nevi of trunk: Secondary | ICD-10-CM | POA: Diagnosis not present

## 2022-09-05 DIAGNOSIS — M25561 Pain in right knee: Secondary | ICD-10-CM | POA: Diagnosis not present

## 2022-09-17 DIAGNOSIS — S30863A Insect bite (nonvenomous) of scrotum and testes, initial encounter: Secondary | ICD-10-CM | POA: Diagnosis not present

## 2022-10-02 DIAGNOSIS — S83281A Other tear of lateral meniscus, current injury, right knee, initial encounter: Secondary | ICD-10-CM | POA: Diagnosis not present

## 2022-10-02 DIAGNOSIS — M948X6 Other specified disorders of cartilage, lower leg: Secondary | ICD-10-CM | POA: Diagnosis not present

## 2022-10-02 DIAGNOSIS — M1711 Unilateral primary osteoarthritis, right knee: Secondary | ICD-10-CM | POA: Diagnosis not present

## 2022-10-02 DIAGNOSIS — G8918 Other acute postprocedural pain: Secondary | ICD-10-CM | POA: Diagnosis not present

## 2022-10-02 DIAGNOSIS — S83231A Complex tear of medial meniscus, current injury, right knee, initial encounter: Secondary | ICD-10-CM | POA: Diagnosis not present

## 2022-10-02 DIAGNOSIS — M23221 Derangement of posterior horn of medial meniscus due to old tear or injury, right knee: Secondary | ICD-10-CM | POA: Diagnosis not present

## 2022-10-02 DIAGNOSIS — M2341 Loose body in knee, right knee: Secondary | ICD-10-CM | POA: Diagnosis not present

## 2022-11-03 DIAGNOSIS — M25561 Pain in right knee: Secondary | ICD-10-CM | POA: Diagnosis not present

## 2022-11-06 DIAGNOSIS — M25561 Pain in right knee: Secondary | ICD-10-CM | POA: Diagnosis not present

## 2022-11-12 ENCOUNTER — Encounter: Payer: Self-pay | Admitting: Family Medicine

## 2022-11-14 DIAGNOSIS — M1711 Unilateral primary osteoarthritis, right knee: Secondary | ICD-10-CM | POA: Diagnosis not present

## 2022-11-14 DIAGNOSIS — M25561 Pain in right knee: Secondary | ICD-10-CM | POA: Diagnosis not present

## 2022-11-17 DIAGNOSIS — M25461 Effusion, right knee: Secondary | ICD-10-CM | POA: Diagnosis not present

## 2022-11-17 DIAGNOSIS — M25561 Pain in right knee: Secondary | ICD-10-CM | POA: Diagnosis not present

## 2022-11-17 DIAGNOSIS — M1711 Unilateral primary osteoarthritis, right knee: Secondary | ICD-10-CM | POA: Diagnosis not present

## 2022-11-19 DIAGNOSIS — M25561 Pain in right knee: Secondary | ICD-10-CM | POA: Diagnosis not present

## 2022-11-20 DIAGNOSIS — G8918 Other acute postprocedural pain: Secondary | ICD-10-CM | POA: Diagnosis not present

## 2022-11-20 DIAGNOSIS — M11061 Hydroxyapatite deposition disease, right knee: Secondary | ICD-10-CM | POA: Diagnosis not present

## 2022-11-20 DIAGNOSIS — M1711 Unilateral primary osteoarthritis, right knee: Secondary | ICD-10-CM | POA: Diagnosis not present

## 2022-11-28 DIAGNOSIS — M1711 Unilateral primary osteoarthritis, right knee: Secondary | ICD-10-CM | POA: Diagnosis not present

## 2022-11-28 DIAGNOSIS — M25561 Pain in right knee: Secondary | ICD-10-CM | POA: Diagnosis not present

## 2022-12-05 DIAGNOSIS — M25561 Pain in right knee: Secondary | ICD-10-CM | POA: Diagnosis not present

## 2022-12-08 DIAGNOSIS — M25561 Pain in right knee: Secondary | ICD-10-CM | POA: Diagnosis not present

## 2022-12-08 DIAGNOSIS — M25461 Effusion, right knee: Secondary | ICD-10-CM | POA: Diagnosis not present

## 2022-12-08 DIAGNOSIS — M2540 Effusion, unspecified joint: Secondary | ICD-10-CM | POA: Diagnosis not present

## 2022-12-08 DIAGNOSIS — M1120 Other chondrocalcinosis, unspecified site: Secondary | ICD-10-CM | POA: Diagnosis not present

## 2022-12-12 DIAGNOSIS — M1711 Unilateral primary osteoarthritis, right knee: Secondary | ICD-10-CM | POA: Diagnosis not present

## 2022-12-25 DIAGNOSIS — M1120 Other chondrocalcinosis, unspecified site: Secondary | ICD-10-CM | POA: Diagnosis not present

## 2022-12-25 DIAGNOSIS — M25461 Effusion, right knee: Secondary | ICD-10-CM | POA: Diagnosis not present

## 2022-12-25 DIAGNOSIS — M25561 Pain in right knee: Secondary | ICD-10-CM | POA: Diagnosis not present

## 2022-12-25 DIAGNOSIS — M2540 Effusion, unspecified joint: Secondary | ICD-10-CM | POA: Diagnosis not present

## 2022-12-26 DIAGNOSIS — M1711 Unilateral primary osteoarthritis, right knee: Secondary | ICD-10-CM | POA: Diagnosis not present

## 2023-01-30 DIAGNOSIS — M1711 Unilateral primary osteoarthritis, right knee: Secondary | ICD-10-CM | POA: Diagnosis not present

## 2023-03-10 DIAGNOSIS — M1120 Other chondrocalcinosis, unspecified site: Secondary | ICD-10-CM | POA: Diagnosis not present

## 2023-03-10 DIAGNOSIS — Z0000 Encounter for general adult medical examination without abnormal findings: Secondary | ICD-10-CM | POA: Diagnosis not present

## 2023-03-10 DIAGNOSIS — I2510 Atherosclerotic heart disease of native coronary artery without angina pectoris: Secondary | ICD-10-CM | POA: Diagnosis not present

## 2023-03-10 DIAGNOSIS — Z125 Encounter for screening for malignant neoplasm of prostate: Secondary | ICD-10-CM | POA: Diagnosis not present

## 2023-03-10 DIAGNOSIS — E7800 Pure hypercholesterolemia, unspecified: Secondary | ICD-10-CM | POA: Diagnosis not present

## 2023-03-10 DIAGNOSIS — M25461 Effusion, right knee: Secondary | ICD-10-CM | POA: Diagnosis not present

## 2023-03-28 ENCOUNTER — Other Ambulatory Visit: Payer: Self-pay

## 2023-03-28 ENCOUNTER — Ambulatory Visit (HOSPITAL_BASED_OUTPATIENT_CLINIC_OR_DEPARTMENT_OTHER): Payer: BC Managed Care – PPO | Attending: Orthopaedic Surgery | Admitting: Physical Therapy

## 2023-03-28 DIAGNOSIS — R262 Difficulty in walking, not elsewhere classified: Secondary | ICD-10-CM | POA: Insufficient documentation

## 2023-03-28 DIAGNOSIS — G8929 Other chronic pain: Secondary | ICD-10-CM | POA: Diagnosis not present

## 2023-03-28 DIAGNOSIS — M25561 Pain in right knee: Secondary | ICD-10-CM | POA: Diagnosis not present

## 2023-03-28 DIAGNOSIS — R600 Localized edema: Secondary | ICD-10-CM | POA: Insufficient documentation

## 2023-03-28 DIAGNOSIS — M6281 Muscle weakness (generalized): Secondary | ICD-10-CM | POA: Insufficient documentation

## 2023-03-28 DIAGNOSIS — Z4789 Encounter for other orthopedic aftercare: Secondary | ICD-10-CM | POA: Insufficient documentation

## 2023-03-28 NOTE — Therapy (Signed)
OUTPATIENT PHYSICAL THERAPY LOWER EXTREMITY EVALUATION   Patient Name: Corey Lynch MRN: [id number] DOB:September 05, 1960, 62 y.o., male Today's Date: 03/28/2023  END OF SESSION:  PT End of Session - 03/28/23 1105     Visit Number 1    Number of Visits 16    Date for PT Re-Evaluation 04/10/23    Authorization Type BLUE [employer]    PT Start Time 0830    PT Stop Time 0920    PT Time Calculation (min) 50 min    Activity Tolerance Patient tolerated treatment well    Behavior During Therapy WFL for tasks assessed/performed             No past medical history on file. No past surgical history on file. Patient Active Problem List   Diagnosis Date Noted   Acute lateral meniscal tear, right, initial encounter 07/01/2022   Degenerative disc disease, cervical 10/11/2019   Nonallopathic lesion of cervical region 10/11/2019   Nonallopathic lesion of thoracic region 10/11/2019    PCP: Darrow Bussing, MD  REFERRING PROVIDER: Marcene Corning, MD   REFERRING DIAG: S/P Right Knee Scope   THERAPY DIAG:  Chronic pain of right knee - Plan: PT plan of care cert/re-cert  Difficulty in walking, not elsewhere classified - Plan: PT plan of care cert/re-cert  Muscle weakness (generalized) - Plan: PT plan of care cert/re-cert  Localized edema - Plan: PT plan of care cert/re-cert  Rationale for Evaluation and Treatment: Rehabilitation  ONSET DATE: Feb 2024  SUBJECTIVE:   SUBJECTIVE STATEMENT: Pt states that he has chronic R knee pain with a history of a meniscal tear and a continuous build of fluid. He states that he was very active prior to onset of pain playing basketball multiple times a week. Pt noted onset of pain in February, with two surgical procedures following. First was a debridement and second was to reduce fluid build up. Since second surgery, pt has noted ongoing pain and continued fluid build up. He is now unable to play basketball and has trouble walking.     PERTINENT HISTORY: None.  PAIN:  Are you having pain? Yes: NPRS scale: 2-4/10 Pain location: R knee  Pain description: Dull ache  Aggravating factors: Walking, playing basketball, sleeping, stairs  Relieving factors: Rest   PRECAUTIONS: None  RED FLAGS: None   WEIGHT BEARING RESTRICTIONS: No  FALLS:  Has patient fallen in last 6 months? No  LIVING ENVIRONMENT: Lives with: lives with their family Lives in: House/apartment Stairs: Yes: Internal: 1 flight steps; on right going up and External: 4-5  steps; bilateral but cannot reach both Has following equipment at home: None  OCCUPATION: VP of sales.   PLOF: Independent  PATIENT GOALS: Pt would like to get back to playing basketball without significant pain and swelling.   OBJECTIVE:  Note: Objective measures were completed at Evaluation unless otherwise noted.  DIAGNOSTIC FINDINGS: OA, meniscal tear and swelling.   PATIENT SURVEYS:  FOTO 57.36% 70% predicted.   COGNITION: Overall cognitive status: Within functional limits for tasks assessed     SENSATION: WFL  EDEMA:  Circumferential: R 44", L 41"  POSTURE: No Significant postural limitations  PALPATION: No tenderness to palpation.   LOWER EXTREMITY ROM:  Active ROM Right eval Left eval  Hip flexion [facility]  Hip extension [facility]  Knee flexion [facility] WFL  Knee extension A:-20 P: -10 A: -5   (Blank rows = not tested)  LOWER EXTREMITY MMT:  MMT Right eval Left eval  Hip flexion 4/5 5/5  Knee flexion 4/5 5/5  Knee extension 4-/5 severe pain 5/5   (Blank rows = not tested)   FUNCTIONAL TESTS:  5 times sit to stand: 11.26 sec   GAIT: Distance walked: 32ft  Assistive device utilized: None Level of assistance: Complete Independence Comments: Pt walks with an antalgic gait pattern.    TODAY'S TREATMENT:                                                                                                                              DATE: Creating,  reviewing, and completing below HEP    PATIENT EDUCATION:  Education details: Educated pt on anatomy and physiology of current symptoms, FOTO, diagnosis, prognosis, HEP,  and POC. Person educated: Patient Education method: Medical illustrator Education comprehension: verbalized understanding and returned demonstration  HOME EXERCISE PROGRAM: Access Code: [email or web address] URL: [email or web address] Date: 03/28/2023 Prepared by: Royal Hawthorn  Exercises - Seated Hamstring Stretch  - 2 x daily - 7 x weekly - 2 sets - 10 reps - Supine Quad Set  - 2 x daily - 7 x weekly - 2 sets - 10 reps - 5 hold   ASSESSMENT:  CLINICAL IMPRESSION: Patient referred to PT for chronic R knee pain. He has significant tension in her lateral musculature with an appearance of a superior translation of his bursa. Pt reports continuous fluid build up in his lateral and anterior knee with multiple aspirations over the past year. He walks with an antalgic gait with significant hip drop and decreased knee extension. R LE is weaker than left quivering noted with quad sets today. Pt was referred to Dr Steward Drone for further evaluation. Encouraged him to work on strength and tissue mobility prior to further surgical interventions. Patient will benefit from skilled PT to address below impairments, limitations and improve overall function.  OBJECTIVE IMPAIRMENTS: decreased activity tolerance, difficulty walking, decreased balance, decreased endurance, decreased mobility, decreased ROM, decreased strength, impaired flexibility, impaired UE/LE use, postural dysfunction, and pain.  ACTIVITY LIMITATIONS: bending, lifting, carry, locomotion, cleaning, community activity, driving, and or occupation  PERSONAL FACTORS: chronic knee pain are also affecting patient's functional outcome.  REHAB POTENTIAL: Good  CLINICAL DECISION MAKING: Evolving/moderate complexity  EVALUATION COMPLEXITY: Moderate   GOALS: Short  term PT Goals Target date: 05/09/2024 Pt will be I and compliant with HEP. Baseline:  Goal status: New Pt will decrease pain by 25% overall Baseline: Goal status: New  Long term PT goals Target date: 06/08/2023 Pt will improve ROM to [facility] to improve functional mobility Baseline: Goal status: New Pt will improve  hip/knee strength to at least 5-/5 MMT to improve functional strength Baseline: Goal status: New Pt will improve FOTO to at least 73% functional to show improved function Baseline:  Goal status: New Pt will report 6-8 hours of consecutive sleep without disruption due to pain.  Baseline:  Goal status: New Pt will reduce pain to overall less than 2-3/10 with  usual recreational activities. Baseline: Goal status: New Pt will be able to ambulate community distances at least 1000 ft WNL gait pattern without complaints Baseline: Goal status: New  PLAN: PT FREQUENCY: 1-2 times per week   PT DURATION: 6-8 weeks  PLANNED INTERVENTIONS (unless contraindicated): aquatic PT, Canalith repositioning, cryotherapy, Electrical stimulation, Iontophoresis with 4 mg/ml dexamethasome, Moist heat, traction, Ultrasound, gait training, Therapeutic exercise, balance training, neuromuscular re-education, patient/family education, prosthetic training, manual techniques, passive ROM, dry needling, taping, vasopnuematic device, vestibular, spinal manipulations, joint manipulations  PLAN FOR NEXT SESSION: Assess HEP/update PRN, stretches focusing on IT and lateral HS. Strengthening proximal hip musculature. Progress with dynamic movements and gait as tolerated.    Champ Mungo, PT 03/28/2023, 11:06 AM

## 2023-04-01 ENCOUNTER — Ambulatory Visit (HOSPITAL_BASED_OUTPATIENT_CLINIC_OR_DEPARTMENT_OTHER): Payer: BC Managed Care – PPO | Admitting: Physical Therapy

## 2023-04-01 ENCOUNTER — Encounter (HOSPITAL_BASED_OUTPATIENT_CLINIC_OR_DEPARTMENT_OTHER): Payer: Self-pay | Admitting: Physical Therapy

## 2023-04-01 DIAGNOSIS — M25561 Pain in right knee: Secondary | ICD-10-CM | POA: Diagnosis not present

## 2023-04-01 DIAGNOSIS — M6281 Muscle weakness (generalized): Secondary | ICD-10-CM

## 2023-04-01 DIAGNOSIS — G8929 Other chronic pain: Secondary | ICD-10-CM

## 2023-04-01 DIAGNOSIS — R600 Localized edema: Secondary | ICD-10-CM | POA: Diagnosis not present

## 2023-04-01 DIAGNOSIS — R262 Difficulty in walking, not elsewhere classified: Secondary | ICD-10-CM

## 2023-04-01 DIAGNOSIS — Z4789 Encounter for other orthopedic aftercare: Secondary | ICD-10-CM | POA: Diagnosis not present

## 2023-04-01 NOTE — Therapy (Signed)
OUTPATIENT PHYSICAL THERAPY LOWER EXTREMITY TREATMENT   Patient Name: Corey Lynch MRN: [id number] DOB:1960/06/01, 62 y.o., male Today's Date: 04/01/2023  END OF SESSION:  PT End of Session - 04/01/23 1104     Visit Number 2    Number of Visits 16    Date for PT Re-Evaluation 04/10/23    Authorization Type BLUE [employer] BL[employer]    PT Start Time 1102    PT Stop Time 1142    PT Time Calculation (min) 40 min    Activity Tolerance Patient tolerated treatment well    Behavior During Therapy WFL for tasks assessed/performed             History reviewed. No pertinent past medical history. History reviewed. No pertinent surgical history. Patient Active Problem List   Diagnosis Date Noted   Acute lateral meniscal tear, right, initial encounter 07/01/2022   Degenerative disc disease, cervical 10/11/2019   Nonallopathic lesion of cervical region 10/11/2019   Nonallopathic lesion of thoracic region 10/11/2019    PCP: Darrow Bussing, MD  REFERRING PROVIDER: Marcene Corning, MD   REFERRING DIAG: S/P Right Knee Scope   THERAPY DIAG:  Difficulty in walking, not elsewhere classified  Muscle weakness (generalized)  Localized edema  Chronic pain of right knee  Rationale for Evaluation and Treatment: Rehabilitation  ONSET DATE: Feb 2024  May 9th and June 27th Scopes  SUBJECTIVE:   SUBJECTIVE STATEMENT: Pt states there were no real changes since last session. No change with HEP. Pt states that anti-inflammatories don't make a difference   Eval: Pt states that he has chronic R knee pain with a history of a meniscal tear and a continuous build of fluid. He states that he was very active prior to onset of pain playing basketball multiple times a week. Pt noted onset of pain in February, with two surgical procedures following. First was a debridement and second was to reduce fluid build up. Since second surgery, pt has noted ongoing pain and continued fluid build up. He is  now unable to play basketball and has trouble walking.    PERTINENT HISTORY: None.  PAIN:  Are you having pain? Yes: NPRS scale: 2-4/10 Pain location: R knee  Pain description: Dull ache  Aggravating factors: Walking, playing basketball, sleeping, stairs  Relieving factors: Rest   PRECAUTIONS: None  RED FLAGS: None   WEIGHT BEARING RESTRICTIONS: No  FALLS:  Has patient fallen in last 6 months? No  LIVING ENVIRONMENT: Lives with: lives with their family Lives in: House/apartment Stairs: Yes: Internal: 1 flight steps; on right going up and External: 4-5  steps; bilateral but cannot reach both Has following equipment at home: None  OCCUPATION: VP of sales.   PLOF: Independent  PATIENT GOALS: Pt would like to get back to playing basketball without significant pain and swelling.   OBJECTIVE:  Note: Objective measures were completed at Evaluation unless otherwise noted.  DIAGNOSTIC FINDINGS: OA, meniscal tear and swelling.   PATIENT SURVEYS:  FOTO 57.36% 70% predicted.   COGNITION: Overall cognitive status: Within functional limits for tasks assessed     SENSATION: WFL  EDEMA:  Circumferential: R 44", L 41"  POSTURE: No Significant postural limitations  PALPATION: No tenderness to palpation.   LOWER EXTREMITY ROM:  Active ROM Right eval Left eval  Hip flexion [facility]  Hip extension [facility]  Knee flexion [facility] WFL  Knee extension A:-20 P: -10 A: -5   (Blank rows = not tested)  LOWER EXTREMITY MMT:  MMT Right eval Left eval  Hip flexion 4/5 5/5  Knee flexion 4/5 5/5  Knee extension 4-/5 severe pain 5/5   (Blank rows = not tested)   FUNCTIONAL TESTS:  5 times sit to stand: 11.26 sec   GAIT: Distance walked: 84ft  Assistive device utilized: None Level of assistance: Complete Independence Comments: Pt walks with an antalgic gait pattern.    TODAY'S TREATMENT:                                                                                                                               DATE:  11/6  Edema sweeping  R knee TKE mob grade IV (tibial ER)  Quad set with heel prop 2s 2x10 TKE GTB 3x10 Blue TB knee ext 3x10   PATIENT EDUCATION:  Education details: Educated pt on anatomy and physiology of current symptoms, FOTO, diagnosis, prognosis, HEP,  and POC. Person educated: Patient Education method: Medical illustrator Education comprehension: verbalized understanding and returned demonstration  HOME EXERCISE PROGRAM: Access Code: [email or web address] URL: [email or web address] Date: 03/28/2023 Prepared by: Royal Hawthorn  Exercises - Seated Hamstring Stretch  - 2 x daily - 7 x weekly - 2 sets - 10 reps - Supine Quad Set  - 2 x daily - 7 x weekly - 2 sets - 10 reps - 5 hold   ASSESSMENT:  CLINICAL IMPRESSION:  Pt able to reach -3 deg of knee ext at end of session after starting at -12. Pt reports improvement in knee discomfort with gait following joint mob and exercise. HEP focused on full TKE as well as maintaining quad activation and firing. Pt to start using compression, elevation, and icing to address knee effusion. Sensitivity appears related to signficant knee effusion. Continue with joint mobs and quad strength as tolerated as pt has signficant off loading of R knee with gait as well as ROM deficits. Pt would benefit from continued skilled therapy in order to reach goals and maximize functional  R LE strength and ROM for full return normal community moiblity and exercise.   OBJECTIVE IMPAIRMENTS: decreased activity tolerance, difficulty walking, decreased balance, decreased endurance, decreased mobility, decreased ROM, decreased strength, impaired flexibility, impaired UE/LE use, postural dysfunction, and pain.  ACTIVITY LIMITATIONS: bending, lifting, carry, locomotion, cleaning, community activity, driving, and or occupation  PERSONAL FACTORS: chronic knee pain are also affecting patient's functional  outcome.  REHAB POTENTIAL: Good  CLINICAL DECISION MAKING: Evolving/moderate complexity  EVALUATION COMPLEXITY: Moderate   GOALS: Short term PT Goals Target date: 05/09/2024 Pt will be I and compliant with HEP. Baseline:  Goal status: New Pt will decrease pain by 25% overall Baseline: Goal status: New  Long term PT goals Target date: 06/08/2023 Pt will improve ROM to [facility] to improve functional mobility Baseline: Goal status: New Pt will improve  hip/knee strength to at least 5-/5 MMT to improve functional strength Baseline: Goal status: New Pt will improve FOTO to at least 73% functional to  show improved function Baseline:  Goal status: New Pt will report 6-8 hours of consecutive sleep without disruption due to pain.  Baseline:  Goal status: New Pt will reduce pain to overall less than 2-3/10 with usual recreational activities. Baseline: Goal status: New Pt will be able to ambulate community distances at least 1000 ft WNL gait pattern without complaints Baseline: Goal status: New  PLAN: PT FREQUENCY: 1-2 times per week   PT DURATION: 6-8 weeks  PLANNED INTERVENTIONS (unless contraindicated): aquatic PT, Canalith repositioning, cryotherapy, Electrical stimulation, Iontophoresis with 4 mg/ml dexamethasome, Moist heat, traction, Ultrasound, gait training, Therapeutic exercise, balance training, neuromuscular re-education, patient/family education, prosthetic training, manual techniques, passive ROM, dry needling, taping, vasopnuematic device, vestibular, spinal manipulations, joint manipulations  PLAN FOR NEXT SESSION: Assess HEP/update PRN, stretches focusing on IT and lateral HS. Strengthening proximal hip musculature. Progress with dynamic movements and gait as tolerated. R knee TKE ROM   Zebedee Iba, PT 04/01/2023, 12:00 PM

## 2023-04-17 NOTE — Progress Notes (Unsigned)
   Rubin Payor, PhD, LAT, ATC acting as a scribe for Clementeen Graham, MD.  Corey Lynch is a 62 y.o. male who presents to [facility] today for R knee pain. Pt was previously seen by Dr. Katrinka Blazing on 08/14/22 and was given a R knee steroid injection. He was also referred to Dr. Yisroel Ramming and had arthroscopic surgery on his R knee on My 9th.   Today, pt reports ***  R Knee swelling: Mechanical symptoms: Aggravates: Treatments tried: PT x 2 visits,  Dx imaging: 08/12/22 R knee MRI  07/01/22 R knee XR  Pertinent review of systems: ***  Relevant historical information: ***   Exam:  There were no vitals taken for this visit. General: Well Developed, well nourished, and in no acute distress.   MSK: ***    Lab and Radiology Results No results found for this or any previous visit (from the past 72 hour(s)). No results found.     Assessment and Plan: 63 y.o. male with ***   PDMP not reviewed this encounter. No orders of the defined types were placed in this encounter.  No orders of the defined types were placed in this encounter.    Discussed warning signs or symptoms. Please see discharge instructions. Patient expresses understanding.   ***

## 2023-04-20 ENCOUNTER — Telehealth: Payer: Self-pay

## 2023-04-20 ENCOUNTER — Other Ambulatory Visit: Payer: Self-pay

## 2023-04-20 ENCOUNTER — Ambulatory Visit (INDEPENDENT_AMBULATORY_CARE_PROVIDER_SITE_OTHER): Payer: BC Managed Care – PPO | Admitting: Family Medicine

## 2023-04-20 ENCOUNTER — Encounter: Payer: Self-pay | Admitting: Family Medicine

## 2023-04-20 VITALS — BP 120/76 | HR 81 | Ht 72.0 in | Wt 210.0 lb

## 2023-04-20 DIAGNOSIS — M1711 Unilateral primary osteoarthritis, right knee: Secondary | ICD-10-CM | POA: Diagnosis not present

## 2023-04-20 DIAGNOSIS — M11261 Other chondrocalcinosis, right knee: Secondary | ICD-10-CM

## 2023-04-20 DIAGNOSIS — G8929 Other chronic pain: Secondary | ICD-10-CM | POA: Diagnosis not present

## 2023-04-20 DIAGNOSIS — M25561 Pain in right knee: Secondary | ICD-10-CM

## 2023-04-20 NOTE — Patient Instructions (Addendum)
Thank you for coming in today.   We will work to authorize gel and Designer, multimedia  Once we get [employer] approval, you will hear from my office about scheduling.

## 2023-04-20 NOTE — Telephone Encounter (Signed)
-----   Message from Adron Bene sent at 04/20/2023  8:22 AM EST ----- Regarding: auth Please auth R knee gel and Zilretta

## 2023-04-21 NOTE — Telephone Encounter (Signed)
VOB initiated for GELSYN for RIGHT knee OA

## 2023-04-21 NOTE — Telephone Encounter (Signed)
VOB initiated for ZILRETTA for RIGHT knee OA.

## 2023-04-22 NOTE — Telephone Encounter (Signed)
ZILRETTA for RIGHT knee OA   Primary Insurance: [employer] PA Co-pay: $50 Co-insurance: 20% Deductible: $311.18 of $3,000 met - must be met for coverage to apply Prior Auth: NOT required   Knee Injection History 07/01/22 - Kenalog RIGHT 07/18/22 - Kenalog RIGHT 08/14/22 - Kenalog RIGHT

## 2023-04-22 NOTE — Telephone Encounter (Signed)
No Prior Auth required for [employer]

## 2023-04-27 ENCOUNTER — Ambulatory Visit (HOSPITAL_BASED_OUTPATIENT_CLINIC_OR_DEPARTMENT_OTHER): Payer: BC Managed Care – PPO | Attending: Orthopaedic Surgery | Admitting: Physical Therapy

## 2023-04-27 ENCOUNTER — Encounter (HOSPITAL_BASED_OUTPATIENT_CLINIC_OR_DEPARTMENT_OTHER): Payer: Self-pay | Admitting: Physical Therapy

## 2023-04-27 DIAGNOSIS — M25561 Pain in right knee: Secondary | ICD-10-CM | POA: Insufficient documentation

## 2023-04-27 DIAGNOSIS — R600 Localized edema: Secondary | ICD-10-CM | POA: Diagnosis not present

## 2023-04-27 DIAGNOSIS — R262 Difficulty in walking, not elsewhere classified: Secondary | ICD-10-CM | POA: Diagnosis not present

## 2023-04-27 DIAGNOSIS — M6281 Muscle weakness (generalized): Secondary | ICD-10-CM | POA: Diagnosis not present

## 2023-04-27 DIAGNOSIS — G8929 Other chronic pain: Secondary | ICD-10-CM | POA: Diagnosis not present

## 2023-04-27 NOTE — Telephone Encounter (Signed)
Prior Authorization REQUIRED for GELSYN  A Pre Determination is required for this patient's plan. If you would like assistance with obtaining pre-cert, please upload all clinical information/medical notes to the case or fax to [phone], so that the [facility] team may initiate authorization on your behalf. Once they are uploaded, please notify us via case notes or contact our offices directly at [phone].

## 2023-04-27 NOTE — Telephone Encounter (Signed)
Scheduled 1/25

## 2023-04-27 NOTE — Therapy (Signed)
OUTPATIENT PHYSICAL THERAPY LOWER EXTREMITY TREATMENT   Patient Name: Corey Lynch MRN: [id number] DOB:January 06, 1961, 62 y.o., male Today's Date: 04/27/2023  END OF SESSION:  PT End of Session - 04/27/23 0810     Visit Number 3    Number of Visits 16    Date for PT Re-Evaluation 04/10/23    Authorization Type BLUE CROSS BLUE SHIELD    PT Start Time [phone]   late   PT Stop Time 0840    PT Time Calculation (min) 28 min    Activity Tolerance Patient tolerated treatment well    Behavior During Therapy [facility] for tasks assessed/performed             History reviewed. No pertinent past medical history. History reviewed. No pertinent surgical history. Patient Active Problem List   Diagnosis Date Noted   Primary osteoarthritis of right knee 04/20/2023   Chronic pain of right knee 04/20/2023   Pseudogout of right knee 04/20/2023   Acute lateral meniscal tear, right, initial encounter 07/01/2022   Degenerative disc disease, cervical 10/11/2019   Nonallopathic lesion of cervical region 10/11/2019   Nonallopathic lesion of thoracic region 10/11/2019    PCP: Darrow Bussing, MD  REFERRING PROVIDER: Marcene Corning, MD   REFERRING DIAG: S/P Right Knee Scope   THERAPY DIAG:  Difficulty in walking, not elsewhere classified  Muscle weakness (generalized)  Localized edema  Chronic pain of right knee  Rationale for Evaluation and Treatment: Rehabilitation  ONSET DATE: Feb 2024  May 9th and June 27th Scopes  SUBJECTIVE:   SUBJECTIVE STATEMENT: Pt states there is no change. He saw MD and is planning to have gel and zilretta injections once insurance is authorized.    Eval: Pt states that he has chronic R knee pain with a history of a meniscal tear and a continuous build of fluid. He states that he was very active prior to onset of pain playing basketball multiple times a week. Pt noted onset of pain in February, with two surgical procedures following. First was a debridement and  second was to reduce fluid build up. Since second surgery, pt has noted ongoing pain and continued fluid build up. He is now unable to play basketball and has trouble walking.    PERTINENT HISTORY: None.  PAIN:  Are you having pain? Yes: NPRS scale: 2-4/10 Pain location: R knee  Pain description: Dull ache  Aggravating factors: Walking, playing basketball, sleeping, stairs  Relieving factors: Rest   PRECAUTIONS: None  RED FLAGS: None   WEIGHT BEARING RESTRICTIONS: No  FALLS:  Has patient fallen in last 6 months? No  LIVING ENVIRONMENT: Lives with: lives with their family Lives in: House/apartment Stairs: Yes: Internal: 1 flight steps; on right going up and External: 4-5  steps; bilateral but cannot reach both Has following equipment at home: None  OCCUPATION: VP of sales.   PLOF: Independent  PATIENT GOALS: Pt would like to get back to playing basketball without significant pain and swelling.   OBJECTIVE:  Note: Objective measures were completed at Evaluation unless otherwise noted.  DIAGNOSTIC FINDINGS: OA, meniscal tear and swelling.   PATIENT SURVEYS:  FOTO 57.36% 70% predicted.   COGNITION: Overall cognitive status: Within functional limits for tasks assessed     SENSATION: WFL  EDEMA:  Circumferential: R 44", L 41"  POSTURE: No Significant postural limitations  PALPATION: No tenderness to palpation.   LOWER EXTREMITY ROM:  Active ROM Right eval Left eval  Hip flexion [facility]  Hip extension [facility]  WFL  Knee flexion [facility] WFL  Knee extension A:-20 P: -10 A: -5   (Blank rows = not tested)  LOWER EXTREMITY MMT:  MMT Right eval Left eval  Hip flexion 4/5 5/5  Knee flexion 4/5 5/5  Knee extension 4-/5 severe pain 5/5   (Blank rows = not tested)   FUNCTIONAL TESTS:  5 times sit to stand: 11.26 sec   GAIT: Distance walked: 58ft  Assistive device utilized: None Level of assistance: Complete Independence Comments: Pt walks with an antalgic  gait pattern.    TODAY'S TREATMENT:                                                                                                                              DATE:  12/2  Edema sweeping  R knee TKE mob grade IV (tibial ER)  LAD with quad set 15x 10lbs LAD 3x10 2s Quad set with heel prop 2s 2x10 Compression; focus of TKE, swelling management with activiity, potential effects of injections and surgical indications   11/6  Edema sweeping  R knee TKE mob grade IV (tibial ER)  Quad set with heel prop 2s 2x10 TKE GTB 3x10 Blue TB knee ext 3x10   PATIENT EDUCATION:  Education details: Educated pt on anatomy and physiology of current symptoms, FOTO, diagnosis, prognosis, HEP,  and POC. Person educated: Patient Education method: Medical illustrator Education comprehension: verbalized understanding and returned demonstration  HOME EXERCISE PROGRAM: Access Code: [email or web address] URL: [email or web address] Date: 03/28/2023 Prepared by: Royal Hawthorn  Exercises - Seated Hamstring Stretch  - 2 x daily - 7 x weekly - 2 sets - 10 reps - Supine Quad Set  - 2 x daily - 7 x weekly - 2 sets - 10 reps - 5 hold   ASSESSMENT:  CLINICAL IMPRESSION:  Pt with improvement of knee ext following session again with improved gait and report of decrease in R knee stiffness. Pt advised to be as consistent as possible with HEP to focus on gaining TKE and maintaining quad firing and strength. Pt to have Zilretta and gel njections upcoming with MD if authorized. Continue with joint mobs and quad strength as tolerated as pt has signficant off loading of R knee with gait as well as ROM deficits. Pt would benefit from continued skilled therapy in order to reach goals and maximize functional  R LE strength and ROM for full return normal community moiblity and exercise.   OBJECTIVE IMPAIRMENTS: decreased activity tolerance, difficulty walking, decreased balance, decreased endurance,  decreased mobility, decreased ROM, decreased strength, impaired flexibility, impaired UE/LE use, postural dysfunction, and pain.  ACTIVITY LIMITATIONS: bending, lifting, carry, locomotion, cleaning, community activity, driving, and or occupation  PERSONAL FACTORS: chronic knee pain are also affecting patient's functional outcome.  REHAB POTENTIAL: Good  CLINICAL DECISION MAKING: Evolving/moderate complexity  EVALUATION COMPLEXITY: Moderate   GOALS: Short term PT Goals Target date: 05/09/2024 Pt will be I and compliant with HEP. Baseline:  Goal  status: New Pt will decrease pain by 25% overall Baseline: Goal status: New  Long term PT goals Target date: 06/08/2023 Pt will improve ROM to [facility] to improve functional mobility Baseline: Goal status: New Pt will improve  hip/knee strength to at least 5-/5 MMT to improve functional strength Baseline: Goal status: New Pt will improve FOTO to at least 73% functional to show improved function Baseline:  Goal status: New Pt will report 6-8 hours of consecutive sleep without disruption due to pain.  Baseline:  Goal status: New Pt will reduce pain to overall less than 2-3/10 with usual recreational activities. Baseline: Goal status: New Pt will be able to ambulate community distances at least 1000 ft WNL gait pattern without complaints Baseline: Goal status: New  PLAN: PT FREQUENCY: 1-2 times per week   PT DURATION: 6-8 weeks  PLANNED INTERVENTIONS (unless contraindicated): aquatic PT, Canalith repositioning, cryotherapy, Electrical stimulation, Iontophoresis with 4 mg/ml dexamethasome, Moist heat, traction, Ultrasound, gait training, Therapeutic exercise, balance training, neuromuscular re-education, patient/family education, prosthetic training, manual techniques, passive ROM, dry needling, taping, vasopnuematic device, vestibular, spinal manipulations, joint manipulations  PLAN FOR NEXT SESSION: Assess HEP/update PRN, stretches  focusing on IT and lateral HS. Strengthening proximal hip musculature. Progress with dynamic movements and gait as tolerated. R knee TKE ROM   Zebedee Iba, PT 04/27/2023, 8:52 AM

## 2023-04-30 ENCOUNTER — Other Ambulatory Visit: Payer: Self-pay

## 2023-04-30 ENCOUNTER — Ambulatory Visit (INDEPENDENT_AMBULATORY_CARE_PROVIDER_SITE_OTHER): Payer: BC Managed Care – PPO | Admitting: Family Medicine

## 2023-04-30 DIAGNOSIS — M25461 Effusion, right knee: Secondary | ICD-10-CM | POA: Diagnosis not present

## 2023-04-30 DIAGNOSIS — M11261 Other chondrocalcinosis, right knee: Secondary | ICD-10-CM

## 2023-04-30 DIAGNOSIS — M1711 Unilateral primary osteoarthritis, right knee: Secondary | ICD-10-CM | POA: Diagnosis not present

## 2023-04-30 DIAGNOSIS — M25561 Pain in right knee: Secondary | ICD-10-CM | POA: Diagnosis not present

## 2023-04-30 DIAGNOSIS — G8929 Other chronic pain: Secondary | ICD-10-CM

## 2023-04-30 LAB — SYNOVIAL FLUID ANALYSIS, COMPLETE
Basophils, %: 0 %
Eosinophils-Synovial: 0 % (ref 0–2)
Lymphocytes-Synovial Fld: 43 % (ref 0–74)
Monocyte/Macrophage: 35 % (ref 0–69)
Neutrophil, Synovial: 20 % (ref 0–24)
Synoviocytes, %: 2 % (ref 0–15)
WBC, Synovial: 89 {cells}/uL (ref <150)

## 2023-04-30 MED ORDER — TRIAMCINOLONE ACETONIDE 32 MG IX SRER
32.0000 mg | Freq: Once | INTRA_ARTICULAR | Status: AC
  Administered 2023-04-30: 32 mg via INTRA_ARTICULAR

## 2023-04-30 NOTE — Progress Notes (Signed)
  Zilretta injection right knee Procedure: Real-time Ultrasound Guided aspiration and injection of right knee joint superior lateral patellar space Device: Philips Affiniti 50G Images permanently stored and available for review in PACS Ultrasound evaluation prior to injection reveals a moderate joint effusion. Verbal informed consent obtained.  Discussed risks and benefits of procedure. Warned about infection, hyperglycemia bleeding, damage to structures among others. Patient expresses understanding and agreement Time-out conducted.   Noted no overlying erythema, induration, or other signs of local infection.   Skin prepped in a sterile fashion.   Local anesthesia: Topical Ethyl chloride.   With sterile technique and under real time ultrasound guidance: 2 mL of lidocaine injected into subcutaneous tissue. Skin was again sterilized with isopropyl alcohol. 18-gauge needle used to access the knee joint and 25 mL of slightly cloudy yellow fluid was aspirated. Syringe was exchanged and Zilretta 32 mg injected into knee joint. Fluid seen entering the joint capsule.   Completed without difficulty   Advised to call if fevers/chills, erythema, induration, drainage, or persistent bleeding.   Images permanently stored and available for review in the ultrasound unit.  Impression: Technically successful ultrasound guided injection.  Lot number: [id number] Will send fluid for fluid analysis checking for pseudogout crystals.

## 2023-04-30 NOTE — Telephone Encounter (Addendum)
[facility] - [facility]  Prior Authorization initiated for GELSYN via [email or web address] KEY: [id number]

## 2023-04-30 NOTE — Patient Instructions (Signed)
Thank you for coming in today.   Call or go to the ER if you develop a large red swollen joint with extreme pain or oozing puss.   

## 2023-05-01 MED ORDER — GELSYN-3 16.8 MG/2ML IX SOSY: PREFILLED_SYRINGE | INTRA_ARTICULAR | 0 refills | Status: AC

## 2023-05-01 NOTE — Progress Notes (Signed)
Knee fluid did not show any pseudogout crystals this time.

## 2023-05-01 NOTE — Telephone Encounter (Signed)
RX for GELSYN sent to [facility] for fulfillment.   RIGHT knee, 6 mL / 0 refill

## 2023-05-01 NOTE — Telephone Encounter (Signed)
Prior Auth for [facility] APPROVED PA# [id number] Valid: 04/30/23-04/29/24

## 2023-05-01 NOTE — Addendum Note (Signed)
Addended by: Dierdre Searles on: 05/01/2023 10:39 AM   Modules accepted: Orders

## 2023-05-06 NOTE — Telephone Encounter (Signed)
Pt received Zilretta inj for right knee OA on 04/30/23. Can consider repeat inj on or after 07/24/23

## 2023-05-07 ENCOUNTER — Encounter (HOSPITAL_BASED_OUTPATIENT_CLINIC_OR_DEPARTMENT_OTHER): Payer: BLUE CROSS/BLUE SHIELD | Admitting: Physical Therapy

## 2023-05-08 ENCOUNTER — Ambulatory Visit (HOSPITAL_BASED_OUTPATIENT_CLINIC_OR_DEPARTMENT_OTHER): Payer: BC Managed Care – PPO | Admitting: Orthopaedic Surgery

## 2023-05-08 DIAGNOSIS — S83281A Other tear of lateral meniscus, current injury, right knee, initial encounter: Secondary | ICD-10-CM

## 2023-05-08 NOTE — Progress Notes (Signed)
Chief Complaint: Right knee pain     History of Present Illness:    Corey Lynch is a 62 y.o. male presents today for continued right knee pain in the setting of previous meniscal debridement.  He states that he is status post medial lateral meniscal debridement with Dr. Jerl Santos in May which was then shortly followed by an additional arthroscopy 7 weeks after.  There was no significant relief following this.  He has recently undergone a Zilretta injection with Dr. Denyse Amass in December of this year which is giving him some relief and improved his swelling.  He was diagnosed with pseudogout for which the knee was previously aspirated as well.  He enjoys being active playing basketball.  He is at this point hoping to avoid a total knee arthroplasty.    PMH/PSH/Family History/Social History/Meds/Allergies:   No past medical history on file. No past surgical history on file. Social History   Socioeconomic History   Marital status: Married    Spouse name: Not on file   Number of children: Not on file   Years of education: Not on file   Highest education level: Not on file  Occupational History   Not on file  Tobacco Use   Smoking status: Every Day    Current packs/day: 0.25    Types: Cigarettes   Smokeless tobacco: Current   Tobacco comments:    smoking 1/3 pack daily 06/04/2020  Substance and Sexual Activity   Alcohol use: Yes   Drug use: Never   Sexual activity: Not on file  Other Topics Concern   Not on file  Social History Narrative   Not on file   Social Drivers of Health   Financial Resource Strain: Not on file  Food Insecurity: Not on file  Transportation Needs: Not on file  Physical Activity: Not on file  Stress: Not on file  Social Connections: Not on file   Family History  Problem Relation Age of Onset   Basal cell carcinoma Brother    Heart attack Mother    No Known Allergies Current Outpatient Medications  Medication Sig Dispense Refill   atorvastatin  (LIPITOR) 20 MG tablet 1 tablet     colchicine 0.6 MG tablet TAKE 1 TABLET BY MOUTH EVERY DAY Oral for 90 Days     hydroxychloroquine (PLAQUENIL) 200 MG tablet Take 400 mg by mouth daily.     sodium hyaluronate, viscosup, (GELSYN-3) 16.8 MG/2ML SOSY intra-articular injection Dr. Denyse Amass to inj 2 mL, intra-articular, right knee, once weekly x 3 weeks 6 mL 0   No current facility-administered medications for this visit.   No results found.  Review of Systems:   A ROS was performed including pertinent positives and negatives as documented in the HPI.  Physical Exam :   Constitutional: NAD and appears stated age Neurological: Alert and oriented Psych: Appropriate affect and cooperative There were no vitals taken for this visit.   Comprehensive Musculoskeletal Exam:    Right knee with tenderness about the medial lateral joint line.  Negative McMurray bilaterally.  There is no obvious crepitus.  Negative Lachman, negative posterior drawer.  No laxity with varus or valgus   Imaging:   Xray (3 views right knee): Normal    I personally reviewed and interpreted the radiographs.   Assessment and Plan:   62 y.o. male with right knee pain status post medial lateral meniscal debridements with Dr. Jerl Santos.  Overall I did discuss that his previous MRI did show evidence of early  degenerative findings although I would not necessarily diagnose this as severe osteoarthritis.  We did briefly discuss injectable options and ultimately I do believe he may be a candidate for PRP therapy if he does want to avoid any type of arthroplasty.  At this point it would be helpful to obtain an MRI to see if there is residual meniscal tissue versus any progression of his osteoarthritis so that we can have a candid conversation on treatment options.  I did discuss some of the nuances of knee arthroplasty versus continued injection therapy.  Given his high demand to stay active and wished to delay a knee arthroplasty as  long as possible we did specifically spent some time talking about PRP therapy.  I will plan to see him back following the MRI of the knee so that we can discuss all treatment options and decide on the best strategy for him.   I personally saw and evaluated the patient, and participated in the management and treatment plan.  Huel Cote, MD Attending Physician, Orthopedic Surgery  This document was dictated using Dragon voice recognition software. A reasonable attempt at proof reading has been made to minimize errors.

## 2023-05-11 ENCOUNTER — Encounter (HOSPITAL_BASED_OUTPATIENT_CLINIC_OR_DEPARTMENT_OTHER): Payer: Self-pay | Admitting: Physical Therapy

## 2023-05-11 ENCOUNTER — Ambulatory Visit (HOSPITAL_BASED_OUTPATIENT_CLINIC_OR_DEPARTMENT_OTHER): Payer: BC Managed Care – PPO | Admitting: Physical Therapy

## 2023-05-11 DIAGNOSIS — M25561 Pain in right knee: Secondary | ICD-10-CM | POA: Diagnosis not present

## 2023-05-11 DIAGNOSIS — M6281 Muscle weakness (generalized): Secondary | ICD-10-CM

## 2023-05-11 DIAGNOSIS — R262 Difficulty in walking, not elsewhere classified: Secondary | ICD-10-CM | POA: Diagnosis not present

## 2023-05-11 DIAGNOSIS — R600 Localized edema: Secondary | ICD-10-CM | POA: Diagnosis not present

## 2023-05-11 DIAGNOSIS — G8929 Other chronic pain: Secondary | ICD-10-CM | POA: Diagnosis not present

## 2023-05-11 NOTE — Therapy (Signed)
OUTPATIENT PHYSICAL THERAPY LOWER EXTREMITY TREATMENT   Patient Name: Corey Lynch MRN: [id number] DOB:03-03-1961, 62 y.o., male Today's Date: 05/11/2023  END OF SESSION:  PT End of Session - 05/11/23 0808     Visit Number 4    Number of Visits 16    Date for PT Re-Evaluation 04/10/23    Authorization Type BLUE CROSS BLUE SHIELD    PT Start Time 0801    PT Stop Time 0833    PT Time Calculation (min) 32 min    Activity Tolerance Patient tolerated treatment well    Behavior During Therapy [facility] for tasks assessed/performed              History reviewed. No pertinent past medical history. History reviewed. No pertinent surgical history. Patient Active Problem List   Diagnosis Date Noted   Primary osteoarthritis of right knee 04/20/2023   Chronic pain of right knee 04/20/2023   Pseudogout of right knee 04/20/2023   Acute lateral meniscal tear, right, initial encounter 07/01/2022   Degenerative disc disease, cervical 10/11/2019   Nonallopathic lesion of cervical region 10/11/2019   Nonallopathic lesion of thoracic region 10/11/2019    PCP: Darrow Bussing, MD  REFERRING PROVIDER: Marcene Corning, MD   REFERRING DIAG: S/P Right Knee Scope   THERAPY DIAG:  Difficulty in walking, not elsewhere classified  Muscle weakness (generalized)  Localized edema  Rationale for Evaluation and Treatment: Rehabilitation  ONSET DATE: Feb 2024  May 9th and June 27th Scopes  SUBJECTIVE:   SUBJECTIVE STATEMENT:  Pt states he got the new injections and the swelling is much better and the knee feels good. There is no long discomfort but the knee still feels weak and cannot straighten. Pt has an upcoming family ski trip that he needs to prepare for.   Eval: Pt states that he has chronic R knee pain with a history of a meniscal tear and a continuous build of fluid. He states that he was very active prior to onset of pain playing basketball multiple times a week. Pt noted onset of  pain in February, with two surgical procedures following. First was a debridement and second was to reduce fluid build up. Since second surgery, pt has noted ongoing pain and continued fluid build up. He is now unable to play basketball and has trouble walking.    PERTINENT HISTORY: None.  PAIN:  Are you having pain? No: NPRS scale: 0/10 Pain location: R knee  Pain description: Dull ache  Aggravating factors: Walking, playing basketball, sleeping, stairs  Relieving factors: Rest   PRECAUTIONS: None  RED FLAGS: None   WEIGHT BEARING RESTRICTIONS: No  FALLS:  Has patient fallen in last 6 months? No  LIVING ENVIRONMENT: Lives with: lives with their family Lives in: House/apartment Stairs: Yes: Internal: 1 flight steps; on right going up and External: 4-5  steps; bilateral but cannot reach both Has following equipment at home: None  OCCUPATION: VP of sales.   PLOF: Independent  PATIENT GOALS: Pt would like to get back to playing basketball without significant pain and swelling.   OBJECTIVE:  Note: Objective measures were completed at Evaluation unless otherwise noted.  DIAGNOSTIC FINDINGS: OA, meniscal tear and swelling.   PATIENT SURVEYS:  FOTO 57.36% 70% predicted.   COGNITION: Overall cognitive status: Within functional limits for tasks assessed     SENSATION: WFL  EDEMA:  Circumferential: R 44", L 41"  POSTURE: No Significant postural limitations  PALPATION: No tenderness to palpation.   LOWER EXTREMITY ROM:  Active ROM Right eval Left eval  Hip flexion [facility]  Hip extension [facility]  Knee flexion [facility] WFL  Knee extension A:-20 P: -10 A: -5   (Blank rows = not tested)  LOWER EXTREMITY MMT:  MMT Right eval Left eval  Hip flexion 4/5 5/5  Knee flexion 4/5 5/5  Knee extension 4-/5 severe pain 5/5   (Blank rows = not tested)   FUNCTIONAL TESTS:  5 times sit to stand: 11.26 sec   GAIT: Distance walked: 1ft  Assistive device utilized:  None Level of assistance: Complete Independence Comments: Pt walks with an antalgic gait pattern.    TODAY'S TREATMENT:                                                                                                                              DATE:  12/16  Exercises - Seated Hamstring Stretch  - 2 x daily - 7 x weekly - 1 sets - 3 reps - 30 hold - Prone Knee Extension with 2lb Ankle Weight  - 1 x daily - 7 x weekly - 1 sets - 1 reps - 3-5 min hold - Wall Lunge iso holds - 1 x daily - 2-3 x weekly - 6x 10s - Staggered Leg Press 65lbs - 1 x daily - 2-3 x weekly - 3 sets - 8 reps - Single Leg Knee Extension with Weight Machine  10lbs - 1 x daily - 2-3 x weekly - 3 sets - 10 reps - Standing Bilateral Heel Raise on Step  - 1 x daily - 2-3 x weekly - 2 sets - 20 reps  Exercise progression regression; activity demands of basketball and skiing  12/2  Edema sweeping  R knee TKE mob grade IV (tibial ER)  LAD with quad set 15x 10lbs LAD 3x10 2s Quad set with heel prop 2s 2x10 Compression; focus of TKE, swelling management with activiity, potential effects of injections and surgical indications   11/6  Edema sweeping  R knee TKE mob grade IV (tibial ER)  Quad set with heel prop 2s 2x10 TKE GTB 3x10 Blue TB knee ext 3x10   PATIENT EDUCATION:  Education details: Educated pt on anatomy and physiology of current symptoms, FOTO, diagnosis, prognosis, HEP,  and POC. Person educated: Patient Education method: Medical illustrator Education comprehension: verbalized understanding and returned demonstration  HOME EXERCISE PROGRAM: Access Code: [email or web address] URL: [email or web address] Date: 03/28/2023 Prepared by: Royal Hawthorn  Exercises - Seated Hamstring Stretch  - 2 x daily - 7 x weekly - 2 sets - 10 reps - Supine Quad Set  - 2 x daily - 7 x weekly - 2 sets - 10 reps - 5 hold   ASSESSMENT:  CLINICAL IMPRESSION:  Patient presents to session today with much  improved right knee swelling and pain following Zilretta injection.  Patient able to start light progressive resistance exercise in the gym setting in order to start functional right lower extremity  strengthening.  Patient does still lacking 3 degrees of terminal knee extension that improved to 0 following low load long duration prone hang stretch.  Patient advised to begin with moderate level activity in order to prevent reactive inflammation of the knee with sudden increase in exercise.  Patient advised that he is to work on Emergency planning/management officer at least 2-3 times a week with his new gym program.  PT will progress weekly in order to safely prepare him for a ski trip with his family.  Plan to continue with right single-leg strength, stability, and motor control at future sessions.  Pt would benefit from continued skilled therapy in order to reach goals and maximize functional  R LE strength and ROM for full return normal community moiblity and exercise.   OBJECTIVE IMPAIRMENTS: decreased activity tolerance, difficulty walking, decreased balance, decreased endurance, decreased mobility, decreased ROM, decreased strength, impaired flexibility, impaired UE/LE use, postural dysfunction, and pain.  ACTIVITY LIMITATIONS: bending, lifting, carry, locomotion, cleaning, community activity, driving, and or occupation  PERSONAL FACTORS: chronic knee pain are also affecting patient's functional outcome.  REHAB POTENTIAL: Good  CLINICAL DECISION MAKING: Evolving/moderate complexity  EVALUATION COMPLEXITY: Moderate   GOALS: Short term PT Goals Target date: 05/09/2024 Pt will be I and compliant with HEP. Baseline:  Goal status: New Pt will decrease pain by 25% overall Baseline: Goal status: New  Long term PT goals Target date: 06/08/2023 Pt will improve ROM to [facility] to improve functional mobility Baseline: Goal status: New Pt will improve  hip/knee strength to at least 5-/5 MMT to improve functional  strength Baseline: Goal status: New Pt will improve FOTO to at least 73% functional to show improved function Baseline:  Goal status: New Pt will report 6-8 hours of consecutive sleep without disruption due to pain.  Baseline:  Goal status: New Pt will reduce pain to overall less than 2-3/10 with usual recreational activities. Baseline: Goal status: New Pt will be able to ambulate community distances at least 1000 ft WNL gait pattern without complaints Baseline: Goal status: New  PLAN: PT FREQUENCY: 1-2 times per week   PT DURATION: 6-8 weeks  PLANNED INTERVENTIONS (unless contraindicated): aquatic PT, Canalith repositioning, cryotherapy, Electrical stimulation, Iontophoresis with 4 mg/ml dexamethasome, Moist heat, traction, Ultrasound, gait training, Therapeutic exercise, balance training, neuromuscular re-education, patient/family education, prosthetic training, manual techniques, passive ROM, dry needling, taping, vasopnuematic device, vestibular, spinal manipulations, joint manipulations  PLAN FOR NEXT SESSION: Assess HEP/update PRN, stretches focusing on IT and lateral HS. Strengthening proximal hip musculature. Progress with dynamic movements and gait as tolerated. R knee TKE ROM   Zebedee Iba, PT 05/11/2023, 8:48 AM

## 2023-05-16 NOTE — Therapy (Unsigned)
OUTPATIENT PHYSICAL THERAPY LOWER EXTREMITY TREATMENT   Patient Name: Corey Lynch MRN: [id number] DOB:[birth year], 61 y.o., male Today's Date: 05/18/2023  END OF SESSION:  PT End of Session - 05/18/23 0946     Visit Number 5    Number of Visits 16    Date for PT Re-Evaluation 04/10/23    Authorization Type BLUE CROSS BLUE [employer]    PT Start Time 0800    PT Stop Time 0843    PT Time Calculation (min) 43 min    Activity Tolerance Patient tolerated treatment well    Behavior During Therapy [facility] for tasks assessed/performed               History reviewed. No pertinent past medical history. History reviewed. No pertinent surgical history. Patient Active Problem List   Diagnosis Date Noted   Primary osteoarthritis of right knee 04/20/2023   Chronic pain of right knee 04/20/2023   Pseudogout of right knee 04/20/2023   Acute lateral meniscal tear, right, initial encounter 07/01/2022   Degenerative disc disease, cervical 10/11/2019   Nonallopathic lesion of cervical region 10/11/2019   Nonallopathic lesion of thoracic region 10/11/2019    PCP: Darrow Bussing, MD  REFERRING PROVIDER: Marcene Corning, MD   REFERRING DIAG: S/P Right Knee Scope   THERAPY DIAG:  Difficulty in walking, not elsewhere classified  Localized edema  Muscle weakness (generalized)  Chronic pain of right knee  Rationale for Evaluation and Treatment: Rehabilitation  ONSET DATE: Feb 2024  May 9th and June 27th Scopes  SUBJECTIVE:   SUBJECTIVE STATEMENT:  Pt states he got the new injections and the swelling is much better and the knee feels good. There is no long discomfort but the knee still feels weak and cannot straighten. Pt has an upcoming family ski trip that he needs to prepare for.   Eval: Pt states that he has chronic R knee pain with a history of a meniscal tear and a continuous build of fluid. He states that he was very active prior to onset of pain playing basketball multiple  times a week. Pt noted onset of pain in February, with two surgical procedures following. First was a debridement and second was to reduce fluid build up. Since second surgery, pt has noted ongoing pain and continued fluid build up. He is now unable to play basketball and has trouble walking.    PERTINENT HISTORY: None.  PAIN:  Are you having pain? No: NPRS scale: 0/10 Pain location: R knee  Pain description: Dull ache  Aggravating factors: Walking, playing basketball, sleeping, stairs  Relieving factors: Rest   PRECAUTIONS: None  RED FLAGS: None   WEIGHT BEARING RESTRICTIONS: No  FALLS:  Has patient fallen in last 6 months? No  LIVING ENVIRONMENT: Lives with: lives with their family Lives in: House/apartment Stairs: Yes: Internal: 1 flight steps; on right going up and External: 4-5  steps; bilateral but cannot reach both Has following equipment at home: None  OCCUPATION: VP of sales.   PLOF: Independent  PATIENT GOALS: Pt would like to get back to playing basketball without significant pain and swelling.   OBJECTIVE:  Note: Objective measures were completed at Evaluation unless otherwise noted.  DIAGNOSTIC FINDINGS: OA, meniscal tear and swelling.   PATIENT SURVEYS:  FOTO 57.36% 70% predicted.   COGNITION: Overall cognitive status: Within functional limits for tasks assessed     SENSATION: WFL  EDEMA:  Circumferential: R 44", L 41"  POSTURE: No Significant postural limitations  PALPATION: No tenderness  to palpation.   LOWER EXTREMITY ROM:  Active ROM Right eval Left eval  Hip flexion [facility]  Hip extension [facility]  Knee flexion [facility] WFL  Knee extension A:-20 P: -10 A: -5   (Blank rows = not tested)  LOWER EXTREMITY MMT:  MMT Right eval Left eval  Hip flexion 4/5 5/5  Knee flexion 4/5 5/5  Knee extension 4-/5 severe pain 5/5   (Blank rows = not tested)   FUNCTIONAL TESTS:  5 times sit to stand: 11.26 sec   GAIT: Distance walked: 12ft   Assistive device utilized: None Level of assistance: Complete Independence Comments: Pt walks with an antalgic gait pattern.    TODAY'S TREATMENT:                                                                                                                              DATE: 12/23: - Seated Hamstring Stretch  - 2 x daily - 7 x weekly - 1 sets - 3 reps - 30 hold - Prone Knee Extension with 4lb Ankle Weight - added eccentric control, bilat - 1 x daily - 7 x weekly - 1 sets - 1 reps - 3-5 min hold - Staggered Leg Press 65lbs - 1 x daily - 2-3 x weekly - 3 sets - 8 reps - Resisted hip extension in half kneeling.  - Trigger Point Dry-Needling  Treatment instructions: Expect mild to moderate muscle soreness. S/S of pneumothorax if dry needled over a lung field, and to seek immediate medical attention should they occur. Patient verbalized understanding of these instructions and education. Patient Consent Given: Yes Education handout provided: No Muscles treated: R quad  Electrical stimulation performed: Yes Parameters: low amplitude, mod intensity.  Treatment response/outcome: No adverse effects, multiple twitch responses noted.  Prone contract relax to quads.   12/16  Exercises - Seated Hamstring Stretch  - 2 x daily - 7 x weekly - 1 sets - 3 reps - 30 hold - Prone Knee Extension with 2lb Ankle Weight  - 1 x daily - 7 x weekly - 1 sets - 1 reps - 3-5 min hold - Wall Lunge iso holds - 1 x daily - 2-3 x weekly - 6x 10s - Staggered Leg Press 65lbs - 1 x daily - 2-3 x weekly - 3 sets - 8 reps - Single Leg Knee Extension with Weight Machine  10lbs - 1 x daily - 2-3 x weekly - 3 sets - 10 reps - Standing Bilateral Heel Raise on Step  - 1 x daily - 2-3 x weekly - 2 sets - 20 reps  Exercise progression regression; activity demands of basketball and skiing  12/2  Edema sweeping  R knee TKE mob grade IV (tibial ER)  LAD with quad set 15x 10lbs LAD 3x10 2s Quad set with heel prop 2s  2x10 Compression; focus of TKE, swelling management with activiity, potential effects of injections and surgical indications   11/6  Edema sweeping  R knee TKE mob grade IV (tibial ER)  Quad set with heel prop 2s 2x10 TKE GTB 3x10 Blue TB knee ext 3x10   PATIENT EDUCATION:  Education details: Educated pt on anatomy and physiology of current symptoms, FOTO, diagnosis, prognosis, HEP,  and POC. Person educated: Patient Education method: Medical illustrator Education comprehension: verbalized understanding and returned demonstration  HOME EXERCISE PROGRAM: Access Code: [email or web address] URL: [email or web address] Date: 03/28/2023 Prepared by: Royal Hawthorn  Exercises - Seated Hamstring Stretch  - 2 x daily - 7 x weekly - 2 sets - 10 reps - Supine Quad Set  - 2 x daily - 7 x weekly - 2 sets - 10 reps - 5 hold   ASSESSMENT:  CLINICAL IMPRESSION:  Patient presents to session today with continued familiar pain at medial quad insertion.  Patient able to progress with resistance exercise in the gym setting in order to start functional right lower extremity strengthening. TPRN to quads today followed by contract relax with improvements noted with knee extension. Pt reports feeling looser with no adverse effects  PT will progress weekly in order to safely prepare him for a ski trip with his family.  Plan to continue with right single-leg strength, stability, and motor control at future sessions.  Pt would benefit from continued skilled therapy in order to reach goals and maximize functional  R LE strength and ROM for full return normal community moiblity and exercise.   OBJECTIVE IMPAIRMENTS: decreased activity tolerance, difficulty walking, decreased balance, decreased endurance, decreased mobility, decreased ROM, decreased strength, impaired flexibility, impaired UE/LE use, postural dysfunction, and pain.  ACTIVITY LIMITATIONS: bending, lifting, carry, locomotion, cleaning,  community activity, driving, and or occupation  PERSONAL FACTORS: chronic knee pain are also affecting patient's functional outcome.  REHAB POTENTIAL: Good  CLINICAL DECISION MAKING: Evolving/moderate complexity  EVALUATION COMPLEXITY: Moderate   GOALS: Short term PT Goals Target date: 05/09/2024 Pt will be I and compliant with HEP. Baseline:  Goal status: New Pt will decrease pain by 25% overall Baseline: Goal status: New  Long term PT goals Target date: 06/08/2023 Pt will improve ROM to [facility] to improve functional mobility Baseline: Goal status: New Pt will improve  hip/knee strength to at least 5-/5 MMT to improve functional strength Baseline: Goal status: New Pt will improve FOTO to at least 73% functional to show improved function Baseline:  Goal status: New Pt will report 6-8 hours of consecutive sleep without disruption due to pain.  Baseline:  Goal status: New Pt will reduce pain to overall less than 2-3/10 with usual recreational activities. Baseline: Goal status: New Pt will be able to ambulate community distances at least 1000 ft WNL gait pattern without complaints Baseline: Goal status: New  PLAN: PT FREQUENCY: 1-2 times per week   PT DURATION: 6-8 weeks  PLANNED INTERVENTIONS (unless contraindicated): aquatic PT, Canalith repositioning, cryotherapy, Electrical stimulation, Iontophoresis with 4 mg/ml dexamethasome, Moist heat, traction, Ultrasound, gait training, Therapeutic exercise, balance training, neuromuscular re-education, patient/family education, prosthetic training, manual techniques, passive ROM, dry needling, taping, vasopnuematic device, vestibular, spinal manipulations, joint manipulations  PLAN FOR NEXT SESSION: Assess HEP/update PRN, stretches focusing on IT and lateral HS. Strengthening proximal hip musculature. Progress with dynamic movements and gait as tolerated. R knee TKE ROM   Champ Mungo, PT 05/18/2023, 9:47 AM

## 2023-05-18 ENCOUNTER — Ambulatory Visit (HOSPITAL_BASED_OUTPATIENT_CLINIC_OR_DEPARTMENT_OTHER): Payer: BC Managed Care – PPO | Admitting: Physical Therapy

## 2023-05-18 ENCOUNTER — Encounter (HOSPITAL_BASED_OUTPATIENT_CLINIC_OR_DEPARTMENT_OTHER): Payer: Self-pay | Admitting: Physical Therapy

## 2023-05-18 DIAGNOSIS — R600 Localized edema: Secondary | ICD-10-CM

## 2023-05-18 DIAGNOSIS — G8929 Other chronic pain: Secondary | ICD-10-CM

## 2023-05-18 DIAGNOSIS — R262 Difficulty in walking, not elsewhere classified: Secondary | ICD-10-CM | POA: Diagnosis not present

## 2023-05-18 DIAGNOSIS — M6281 Muscle weakness (generalized): Secondary | ICD-10-CM

## 2023-05-18 DIAGNOSIS — M25561 Pain in right knee: Secondary | ICD-10-CM | POA: Diagnosis not present

## 2023-05-18 NOTE — Telephone Encounter (Signed)
Re-run benefits for 2025. PA exp 04/29/24.

## 2023-05-21 ENCOUNTER — Ambulatory Visit
Admission: RE | Admit: 2023-05-21 | Discharge: 2023-05-21 | Disposition: A | Discharge status: Home / Self Care | Payer: BC Managed Care – PPO | Source: Ambulatory Visit | Attending: Orthopaedic Surgery | Admitting: Orthopaedic Surgery

## 2023-05-21 DIAGNOSIS — S83281A Other tear of lateral meniscus, current injury, right knee, initial encounter: Secondary | ICD-10-CM

## 2023-05-22 NOTE — Therapy (Unsigned)
OUTPATIENT PHYSICAL THERAPY LOWER EXTREMITY TREATMENT   Patient Name: Corey Lynch MRN: [id number] DOB:October 28, 1960, 62 y.o., male Today's Date: 05/25/2023  END OF SESSION:  PT End of Session - 05/25/23 1105     Visit Number 6    Number of Visits 16    Date for PT Re-Evaluation 06/08/23    Authorization Type BLUE CROSS BLUE [employer]IELD    PT Start Time 1055    PT Stop Time 1135    PT Time Calculation (min) 40 min    Activity Tolerance No increased pain;Patient tolerated treatment well    Behavior During Therapy [facility] for tasks assessed/performed                History reviewed. No pertinent past medical history. History reviewed. No pertinent surgical history. Patient Active Problem List   Diagnosis Date Noted   Primary osteoarthritis of right knee 04/20/2023   Chronic pain of right knee 04/20/2023   Pseudogout of right knee 04/20/2023   Acute lateral meniscal tear, right, initial encounter 07/01/2022   Degenerative disc disease, cervical 10/11/2019   Nonallopathic lesion of cervical region 10/11/2019   Nonallopathic lesion of thoracic region 10/11/2019    PCP: Darrow Bussing, MD  REFERRING PROVIDER: Marcene Corning, MD   REFERRING DIAG: S/P Right Knee Scope   THERAPY DIAG:  Difficulty in walking, not elsewhere classified  Localized edema  Muscle weakness (generalized)  Chronic pain of right knee  Rationale for Evaluation and Treatment: Rehabilitation  ONSET DATE: Feb 2024  May 9th and June 27th Scopes  SUBJECTIVE:   SUBJECTIVE STATEMENT:  I have not been active with any exercises at home. I am feeling good though.   Eval: Pt states that he has chronic R knee pain with a history of a meniscal tear and a continuous build of fluid. He states that he was very active prior to onset of pain playing basketball multiple times a week. Pt noted onset of pain in February, with two surgical procedures following. First was a debridement and second was to reduce  fluid build up. Since second surgery, pt has noted ongoing pain and continued fluid build up. He is now unable to play basketball and has trouble walking.    PERTINENT HISTORY: None.  PAIN:  Are you having pain? No: NPRS scale: 0/10 Pain location: R knee  Pain description: Dull ache  Aggravating factors: Walking, playing basketball, sleeping, stairs  Relieving factors: Rest   PRECAUTIONS: None  RED FLAGS: None   WEIGHT BEARING RESTRICTIONS: No  FALLS:  Has patient fallen in last 6 months? No  LIVING ENVIRONMENT: Lives with: lives with their family Lives in: House/apartment Stairs: Yes: Internal: 1 flight steps; on right going up and External: 4-5  steps; bilateral but cannot reach both Has following equipment at home: None  OCCUPATION: VP of sales.   PLOF: Independent  PATIENT GOALS: Pt would like to get back to playing basketball without significant pain and swelling.   OBJECTIVE:  Note: Objective measures were completed at Evaluation unless otherwise noted.  DIAGNOSTIC FINDINGS: OA, meniscal tear and swelling.   PATIENT SURVEYS:  FOTO 57.36% 70% predicted.   COGNITION: Overall cognitive status: Within functional limits for tasks assessed     SENSATION: WFL  EDEMA:  Circumferential: R 44", L 41"  POSTURE: No Significant postural limitations  PALPATION: No tenderness to palpation.   LOWER EXTREMITY ROM:  Active ROM Right eval Left eval R   Hip flexion [facility]   Hip extension [facility]  Knee flexion [place] WFL 110 in prone   Knee extension A:-20 P: -10 A: -5 A: -4   (Blank rows = not tested)  LOWER EXTREMITY MMT:  MMT Right eval Left eval  Hip flexion 4/5 5/5  Knee flexion 4/5 5/5  Knee extension 4-/5 severe pain 5/5   (Blank rows = not tested)   FUNCTIONAL TESTS:  5 times sit to stand: 11.26 sec   GAIT: Distance walked: 67ft  Assistive device utilized: None Level of assistance: Complete Independence Comments: Pt walks with an antalgic  gait pattern.    TODAY'S TREATMENT:                                                                                                                              DATE: 12/30 Dbl, Staggered Leg Press 100lbs 2 up one down heel raises 50 lbs on leg press Squats with black theraband pull ups.  Trigger Point Dry-Needling  Treatment instructions: Expect mild to moderate muscle soreness. S/S of pneumothorax if dry needled over a lung field, and to seek immediate medical attention should they occur. Patient verbalized understanding of these instructions and education. Patient Consent Given: Yes Education handout provided: No Muscles treated: R quad,  HS, lateral gastroc  Electrical stimulation performed: Yes Parameters: low amplitude, mod intensity.  Treatment response/outcome: No adverse effects, multiple twitch responses noted.  Prone contract relax to quads.    12/23: - Seated Hamstring Stretch  - 2 x daily - 7 x weekly - 1 sets - 3 reps - 30 hold - Prone Knee Extension with 4lb Ankle Weight - added eccentric control, bilat - 1 x daily - 7 x weekly - 1 sets - 1 reps - 3-5 min hold - Staggered Leg Press 65 - 1 x daily - 2-3 x weekly - 3 sets - 8 reps - Resisted hip extension in half kneeling.  - Trigger Point Dry-Needling  Treatment instructions: Expect mild to moderate muscle soreness. S/S of pneumothorax if dry needled over a lung field, and to seek immediate medical attention should they occur. Patient verbalized understanding of these instructions and education. Patient Consent Given: Yes Education handout provided: No Muscles treated: R quad  Electrical stimulation performed: Yes Parameters: low amplitude, mod intensity.  Treatment response/outcome: No adverse effects, multiple twitch responses noted.  Prone contract relax to quads.   12/16  Exercises - Seated Hamstring Stretch  - 2 x daily - 7 x weekly - 1 sets - 3 reps - 30 hold - Prone Knee Extension with 2lb Ankle Weight  - 1  x daily - 7 x weekly - 1 sets - 1 reps - 3-5 min hold - Wall Lunge iso holds - 1 x daily - 2-3 x weekly - 6x 10s - Staggered Leg Press 65lbs - 1 x daily - 2-3 x weekly - 3 sets - 8 reps - Single Leg Knee Extension with Weight Machine  10lbs - 1 x daily - 2-3  x weekly - 3 sets - 10 reps - Standing Bilateral Heel Raise on Step  - 1 x daily - 2-3 x weekly - 2 sets - 20 reps  Exercise progression regression; activity demands of basketball and skiing  12/2  Edema sweeping  R knee TKE mob grade IV (tibial ER)  LAD with quad set 15x 10lbs LAD 3x10 2s Quad set with heel prop 2s 2x10 Compression; focus of TKE, swelling management with activiity, potential effects of injections and surgical indications   11/6  Edema sweeping  R knee TKE mob grade IV (tibial ER)  Quad set with heel prop 2s 2x10 TKE GTB 3x10 Blue TB knee ext 3x10   PATIENT EDUCATION:  Education details: Educated pt on anatomy and physiology of current symptoms, FOTO, diagnosis, prognosis, HEP,  and POC. Person educated: Patient Education method: Medical illustrator Education comprehension: verbalized understanding and returned demonstration  HOME EXERCISE PROGRAM: Access Code: [email or web address] URL: [email or web address] Date: 03/28/2023 Prepared by: Royal Hawthorn  Exercises - Seated Hamstring Stretch  - 2 x daily - 7 x weekly - 2 sets - 10 reps - Supine Quad Set  - 2 x daily - 7 x weekly - 2 sets - 10 reps - 5 hold   ASSESSMENT:  CLINICAL IMPRESSION:  Patient presents to session today with no reported pain. He denies compliance with his HEP. Continued with progressions made with resistance exercise in the gym setting in order to start functional right lower extremity strengthening. TPRN to quad, HS and gastroc today followed by contract relax with improvements noted with knee extension. Pt reports feeling looser with no adverse effects. Pt states that he is going on multiple trips for work, and will  call to schedule when/ if time allows. Pt will continue to benefit from skilled PT to address continued deficits.    OBJECTIVE IMPAIRMENTS: decreased activity tolerance, difficulty walking, decreased balance, decreased endurance, decreased mobility, decreased ROM, decreased strength, impaired flexibility, impaired UE/LE use, postural dysfunction, and pain.  ACTIVITY LIMITATIONS: bending, lifting, carry, locomotion, cleaning, community activity, driving, and or occupation  PERSONAL FACTORS: chronic knee pain are also affecting patient's functional outcome.  REHAB POTENTIAL: Good  CLINICAL DECISION MAKING: Evolving/moderate complexity  EVALUATION COMPLEXITY: Moderate   GOALS: Short term PT Goals Target date: 05/09/2024 Pt will be I and compliant with HEP. Baseline:  Goal status: New Pt will decrease pain by 25% overall Baseline: Goal status: New  Long term PT goals Target date: 06/08/2023 Pt will improve ROM to [facility] to improve functional mobility Baseline: Goal status: New Pt will improve  hip/knee strength to at least 5-/5 MMT to improve functional strength Baseline: Goal status: New Pt will improve FOTO to at least 73% functional to show improved function Baseline:  Goal status: New Pt will report 6-8 hours of consecutive sleep without disruption due to pain.  Baseline:  Goal status: New Pt will reduce pain to overall less than 2-3/10 with usual recreational activities. Baseline: Goal status: New Pt will be able to ambulate community distances at least 1000 ft WNL gait pattern without complaints Baseline: Goal status: New  PLAN: PT FREQUENCY: 1-2 times per week   PT DURATION: 6-8 weeks  PLANNED INTERVENTIONS (unless contraindicated): aquatic PT, Canalith repositioning, cryotherapy, Electrical stimulation, Iontophoresis with 4 mg/ml dexamethasome, Moist heat, traction, Ultrasound, gait training, Therapeutic exercise, balance training, neuromuscular re-education,  patient/family education, prosthetic training, manual techniques, passive ROM, dry needling, taping, vasopnuematic device, vestibular, spinal manipulations, joint manipulations  PLAN FOR NEXT SESSION: Assess  HEP/update PRN, stretches focusing on IT and lateral HS. Strengthening proximal hip musculature. Progress with dynamic movements and gait as tolerated. R knee TKE ROM   Champ Mungo, PT 05/25/2023, 11:37 AM

## 2023-05-25 ENCOUNTER — Ambulatory Visit (HOSPITAL_BASED_OUTPATIENT_CLINIC_OR_DEPARTMENT_OTHER): Payer: BC Managed Care – PPO | Admitting: Physical Therapy

## 2023-05-25 ENCOUNTER — Encounter (HOSPITAL_BASED_OUTPATIENT_CLINIC_OR_DEPARTMENT_OTHER): Payer: Self-pay | Admitting: Physical Therapy

## 2023-05-25 DIAGNOSIS — G8929 Other chronic pain: Secondary | ICD-10-CM | POA: Diagnosis not present

## 2023-05-25 DIAGNOSIS — M6281 Muscle weakness (generalized): Secondary | ICD-10-CM | POA: Diagnosis not present

## 2023-05-25 DIAGNOSIS — R600 Localized edema: Secondary | ICD-10-CM

## 2023-05-25 DIAGNOSIS — R262 Difficulty in walking, not elsewhere classified: Secondary | ICD-10-CM

## 2023-05-25 DIAGNOSIS — M25561 Pain in right knee: Secondary | ICD-10-CM | POA: Diagnosis not present

## 2023-05-28 ENCOUNTER — Encounter (HOSPITAL_BASED_OUTPATIENT_CLINIC_OR_DEPARTMENT_OTHER): Payer: Self-pay | Admitting: Orthopaedic Surgery

## 2023-06-04 ENCOUNTER — Encounter (HOSPITAL_BASED_OUTPATIENT_CLINIC_OR_DEPARTMENT_OTHER): Payer: Self-pay | Admitting: Physical Therapy

## 2023-06-04 ENCOUNTER — Ambulatory Visit (HOSPITAL_BASED_OUTPATIENT_CLINIC_OR_DEPARTMENT_OTHER): Payer: BC Managed Care – PPO | Attending: Orthopaedic Surgery | Admitting: Physical Therapy

## 2023-06-04 DIAGNOSIS — M6281 Muscle weakness (generalized): Secondary | ICD-10-CM | POA: Insufficient documentation

## 2023-06-04 DIAGNOSIS — R262 Difficulty in walking, not elsewhere classified: Secondary | ICD-10-CM | POA: Insufficient documentation

## 2023-06-04 DIAGNOSIS — R600 Localized edema: Secondary | ICD-10-CM | POA: Insufficient documentation

## 2023-06-04 DIAGNOSIS — G8929 Other chronic pain: Secondary | ICD-10-CM | POA: Insufficient documentation

## 2023-06-04 DIAGNOSIS — M25561 Pain in right knee: Secondary | ICD-10-CM | POA: Diagnosis not present

## 2023-06-04 NOTE — Therapy (Signed)
 OUTPATIENT PHYSICAL THERAPY LOWER EXTREMITY TREATMENT   Patient Name: Corey Lynch MRN: [id number] DOB:[birth year], 63 y.o., male Today's Date: 06/04/2023  END OF SESSION:  PT End of Session - 06/04/23 1658     Visit Number 7    Number of Visits 16    Date for PT Re-Evaluation 09/02/23    Authorization Type BLUE [employer]    PT Start Time 1616    PT Stop Time 1657    PT Time Calculation (min) 41 min    Activity Tolerance No increased pain;Patient tolerated treatment well    Behavior During Therapy [facility] for tasks assessed/performed                 History reviewed. No pertinent past medical history. History reviewed. No pertinent surgical history. Patient Active Problem List   Diagnosis Date Noted   Primary osteoarthritis of right knee 04/20/2023   Chronic pain of right knee 04/20/2023   Pseudogout of right knee 04/20/2023   Acute lateral meniscal tear, right, initial encounter 07/01/2022   Degenerative disc disease, cervical 10/11/2019   Nonallopathic lesion of cervical region 10/11/2019   Nonallopathic lesion of thoracic region 10/11/2019    PCP: Regino Slater, MD  REFERRING PROVIDER: Sheril Coy, MD   REFERRING DIAG: S/P Right Knee Scope   THERAPY DIAG:  Difficulty in walking, not elsewhere classified  Localized edema  Muscle weakness (generalized)  Rationale for Evaluation and Treatment: Rehabilitation  ONSET DATE: Feb 2024  May 9th and June 27th Scopes  SUBJECTIVE:   SUBJECTIVE STATEMENT:  Pt has been doing exercise in the gym. His L knee hurts today. The R knee pops with every step.   Eval: Pt states that he has chronic R knee pain with a history of a meniscal tear and a continuous build of fluid. He states that he was very active prior to onset of pain playing basketball multiple times a week. Pt noted onset of pain in February, with two surgical procedures following. First was a debridement and second was to reduce fluid build up.  Since second surgery, pt has noted ongoing pain and continued fluid build up. He is now unable to play basketball and has trouble walking.    PERTINENT HISTORY: None.  PAIN:  Are you having pain? No: NPRS scale: 0/10 Pain location: R knee  Pain description: Dull ache  Aggravating factors: Walking, playing basketball, sleeping, stairs  Relieving factors: Rest   PRECAUTIONS: None  RED FLAGS: None   WEIGHT BEARING RESTRICTIONS: No  FALLS:  Has patient fallen in last 6 months? No  LIVING ENVIRONMENT: Lives with: lives with their family Lives in: House/apartment Stairs: Yes: Internal: 1 flight steps; on right going up and External: 4-5  steps; bilateral but cannot reach both Has following equipment at home: None  OCCUPATION: VP of sales.   PLOF: Independent  PATIENT GOALS: Pt would like to get back to playing basketball without significant pain and swelling.   OBJECTIVE:  Note: Objective measures were completed at Evaluation unless otherwise noted.  DIAGNOSTIC FINDINGS: OA, meniscal tear and swelling.   PATIENT SURVEYS:  FOTO 57.36% 70% predicted.  1/9 FOTO 64%  COGNITION: Overall cognitive status: Within functional limits for tasks assessed     SENSATION: WFL  EDEMA:  Circumferential: R 41 cm, L 41cm  POSTURE: No Significant postural limitations  PALPATION: No tenderness to palpation.   LOWER EXTREMITY ROM:  Active ROM Right eval Left eval R  1/9  Hip flexion [facility]  Hip extension [facility]   Knee flexion [facility] WFL 122  Knee extension A:-20 P: -10 A: -5 -1   (Blank rows = not tested)  LOWER EXTREMITY MMT:  MMT Right eval R 1/9 Left eval  Hip flexion 4/5 4+/5 5/5  Knee flexion 4/5 4+/5 5/5  Knee extension 4-/5 severe pain 4/5 5/5   (Blank rows = not tested)   FUNCTIONAL TESTS:  5 times sit to stand: 10.9 sec   GAIT: Distance walked: 13ft  Assistive device utilized: None Level of assistance: Complete Independence Comments:WFL, mild R knee  flexion, lack of TKE   TODAY'S TREATMENT:                                                                                                                              DATE: 1/9  Recumbent bike warm up lvl 1 5 min Bosu step up 3x12 Wall lunge iso holds 2x5 15s SLS squat to table 3x8  STM to L HS and calf group   12/30 Dbl, Staggered Leg Press 100lbs 2 up one down heel raises 50 lbs on leg press Squats with black theraband pull ups.  Trigger Point Dry-Needling  Treatment instructions: Expect mild to moderate muscle soreness. S/S of pneumothorax if dry needled over a lung field, and to seek immediate medical attention should they occur. Patient verbalized understanding of these instructions and education. Patient Consent Given: Yes Education handout provided: No Muscles treated: R quad,  HS, lateral gastroc  Electrical stimulation performed: Yes Parameters: low amplitude, mod intensity.  Treatment response/outcome: No adverse effects, multiple twitch responses noted.  Prone contract relax to quads.    12/23: - Seated Hamstring Stretch  - 2 x daily - 7 x weekly - 1 sets - 3 reps - 30 hold - Prone Knee Extension with 4lb Ankle Weight - added eccentric control, bilat - 1 x daily - 7 x weekly - 1 sets - 1 reps - 3-5 min hold - Staggered Leg Press 65 - 1 x daily - 2-3 x weekly - 3 sets - 8 reps - Resisted hip extension in half kneeling.  - Trigger Point Dry-Needling  Treatment instructions: Expect mild to moderate muscle soreness. S/S of pneumothorax if dry needled over a lung field, and to seek immediate medical attention should they occur. Patient verbalized understanding of these instructions and education. Patient Consent Given: Yes Education handout provided: No Muscles treated: R quad  Electrical stimulation performed: Yes Parameters: low amplitude, mod intensity.  Treatment response/outcome: No adverse effects, multiple twitch responses noted.  Prone contract relax to quads.    12/16  Exercises - Seated Hamstring Stretch  - 2 x daily - 7 x weekly - 1 sets - 3 reps - 30 hold - Prone Knee Extension with 2lb Ankle Weight  - 1 x daily - 7 x weekly - 1 sets - 1 reps - 3-5 min hold - Wall Lunge iso holds - 1 x daily -  2-3 x weekly - 6x 10s - Staggered Leg Press 65lbs - 1 x daily - 2-3 x weekly - 3 sets - 8 reps - Single Leg Knee Extension with Weight Machine  10lbs - 1 x daily - 2-3 x weekly - 3 sets - 10 reps - Standing Bilateral Heel Raise on Step  - 1 x daily - 2-3 x weekly - 2 sets - 20 reps  Exercise progression regression; activity demands of basketball and skiing  12/2  Edema sweeping  R knee TKE mob grade IV (tibial ER)  LAD with quad set 15x 10lbs LAD 3x10 2s Quad set with heel prop 2s 2x10 Compression; focus of TKE, swelling management with activiity, potential effects of injections and surgical indications   11/6  Edema sweeping  R knee TKE mob grade IV (tibial ER)  Quad set with heel prop 2s 2x10 TKE GTB 3x10 Blue TB knee ext 3x10   PATIENT EDUCATION:  Education details: Educated pt on anatomy and physiology of current symptoms, FOTO, diagnosis, prognosis, HEP,  and POC. Person educated: Patient Education method: Medical Illustrator Education comprehension: verbalized understanding and returned demonstration  HOME EXERCISE PROGRAM: Access Code: [email or web address] URL: [email or web address] Date: 03/28/2023 Prepared by: Rojean Batten  Exercises - Seated Hamstring Stretch  - 2 x daily - 7 x weekly - 2 sets - 10 reps - Supine Quad Set  - 2 x daily - 7 x weekly - 2 sets - 10 reps - 5 hold   ASSESSMENT:  CLINICAL IMPRESSION:  Pt returns today to clinic from out of town travel. Pt does have subjective and objective improvements. Pt's pain has greatly reduced to 0/10 at baseline but continues to have weakness, gait limitation, and functional mobility deficits in addition to mild pain when exerting higher effort. Pt's SL  stability is also impaired but has improved with R LE CKC exercise and stability. Plan to continue with current POC and progression of R LE strength given recent findings of progressive chondral injury for prevention of further functional decline. POC extended today due to pt missing portions of therapy due to travel. Pt will continue to benefit from skilled PT to address continued deficits.    OBJECTIVE IMPAIRMENTS: decreased activity tolerance, difficulty walking, decreased balance, decreased endurance, decreased mobility, decreased ROM, decreased strength, impaired flexibility, impaired UE/LE use, postural dysfunction, and pain.  ACTIVITY LIMITATIONS: bending, lifting, carry, locomotion, cleaning, community activity, driving, and or occupation  PERSONAL FACTORS: chronic knee pain are also affecting patient's functional outcome.  REHAB POTENTIAL: Good  CLINICAL DECISION MAKING: Evolving/moderate complexity  EVALUATION COMPLEXITY: Moderate   GOALS: Short term PT Goals Target date: 05/09/2024 Pt will be I and compliant with HEP. Baseline:  Goal status: ongoing Pt will decrease pain by 25% overall Baseline: Goal status: MET  Long term PT goals Target date: 08/27/2023  Pt will improve ROM to [facility] to improve functional mobility Baseline: Goal status: MET Pt will improve  hip/knee strength to at least 5-/5 MMT to improve functional strength Baseline: Goal status: ongoing Pt will improve FOTO to at least 73% functional to show improved function Baseline:  Goal status: ongoing Pt will report 6-8 hours of consecutive sleep without disruption due to pain.  Baseline:  Goal status: MET Pt will reduce pain to overall less than 2-3/10 with usual recreational activities. Baseline: Goal status: ongoing Pt will be able to ambulate community distances at least 1000 ft WNL gait pattern without complaints Baseline: Goal status: ongoing  PLAN: PT FREQUENCY: 1-2 times  per week   PT  DURATION: 12 weeks  PLANNED INTERVENTIONS (unless contraindicated): aquatic PT, Canalith repositioning, cryotherapy, Electrical stimulation, Iontophoresis with 4 mg/ml dexamethasome, Moist heat, traction, Ultrasound, gait training, Therapeutic exercise, balance training, neuromuscular re-education, patient/family education, prosthetic training, manual techniques, passive ROM, dry needling, taping, vasopnuematic device, vestibular, spinal manipulations, joint manipulations  PLAN FOR NEXT SESSION: Assess HEP/update PRN, stretches focusing on IT and lateral HS. Strengthening proximal hip musculature. Progress with dynamic movements and gait as tolerated. R knee TKE ROM   Dale Call, PT 06/04/2023, 7:57 PM

## 2023-06-05 ENCOUNTER — Ambulatory Visit (HOSPITAL_BASED_OUTPATIENT_CLINIC_OR_DEPARTMENT_OTHER): Payer: BC Managed Care – PPO | Admitting: Orthopaedic Surgery

## 2023-06-05 DIAGNOSIS — M8440XA Pathological fracture, unspecified site, initial encounter for fracture: Secondary | ICD-10-CM | POA: Diagnosis not present

## 2023-06-05 NOTE — Progress Notes (Signed)
 Chief Complaint: Right knee pain     History of Present Illness:   06/05/2023: Presents today for follow-up and MRI discussion of the right knee  Corey Lynch is a 63 y.o. male presents today for continued right knee pain in the setting of previous meniscal debridement.  He states that he is status post medial lateral meniscal debridement with Dr. Dalldorf in May which was then shortly followed by an additional arthroscopy 7 weeks after.  There was no significant relief following this.  He has recently undergone a Zilretta  injection with Dr. Joane in December of this year which is giving him some relief and improved his swelling.  He was diagnosed with pseudogout for which the knee was previously aspirated as well.  He enjoys being active playing basketball.  He is at this point hoping to avoid a total knee arthroplasty.    PMH/PSH/Family History/Social History/Meds/Allergies:   No past medical history on file. No past surgical history on file. Social History   Socioeconomic History   Marital status: Married    Spouse name: Not on file   Number of children: Not on file   Years of education: Not on file   Highest education level: Not on file  Occupational History   Not on file  Tobacco Use   Smoking status: Every Day    Current packs/day: 0.25    Types: Cigarettes   Smokeless tobacco: Current   Tobacco comments:    smoking 1/3 pack daily 06/04/2020  Substance and Sexual Activity   Alcohol use: Yes   Drug use: Never   Sexual activity: Not on file  Other Topics Concern   Not on file  Social History Narrative   Not on file   Social Drivers of Health   Financial Resource Strain: Not on file  Food Insecurity: Not on file  Transportation Needs: Not on file  Physical Activity: Not on file  Stress: Not on file  Social Connections: Not on file   Family History  Problem Relation Age of Onset   Basal cell carcinoma Brother    Heart attack Mother    No Known  Allergies Current Outpatient Medications  Medication Sig Dispense Refill   atorvastatin (LIPITOR) 20 MG tablet 1 tablet     colchicine 0.6 MG tablet TAKE 1 TABLET BY MOUTH EVERY DAY Oral for 90 Days     hydroxychloroquine (PLAQUENIL) 200 MG tablet Take 400 mg by mouth daily.     sodium hyaluronate, viscosup, (GELSYN-3) 16.8 MG/2ML SOSY intra-articular injection Dr. Joane to inj 2 mL, intra-articular, right knee, once weekly x 3 weeks 6 mL 0   No current facility-administered medications for this visit.   No results found.  Review of Systems:   A ROS was performed including pertinent positives and negatives as documented in the HPI.  Physical Exam :   Constitutional: NAD and appears stated age Neurological: Alert and oriented Psych: Appropriate affect and cooperative There were no vitals taken for this visit.   Comprehensive Musculoskeletal Exam:    Right knee with tenderness about the medial and lateral joint line.  Negative McMurray bilaterally.  There is no obvious crepitus.  Negative Lachman, negative posterior drawer.  No laxity with varus or valgus.   Imaging:   Xray (3 views right knee): Normal  MRI right knee: Medial tibiofemoral osteoarthritis, with preserved lateral tibiofemoral joint space with a severe subchondral insufficiency bruise, diminished medial lateral meniscal tears bilaterally  I personally reviewed and interpreted the radiographs.  Assessment and Plan:   63 y.o. male with right knee pain status post medial lateral meniscal debridements with Dr. Dalldorf.  Overall I did discuss that his previous MRI did show evidence of early degenerative findings although I would not necessarily diagnose this as severe osteoarthritis.  I did describe his MRI findings are quite complex.  Overall he does have progression of arthritis in his medial tibiofemoral joint space although he does not necessarily have significant pain other than limitations in his ability to progress  his activity.  I did describe that his lateral tibial plateau bony bruise is quite a unique problem.  At this point he is vehemently opposed to knee arthroplasty.  Given this I do believe that his treatment options would be to consider an intra-articular injection such as PRP.  We did discuss surgical options.  I do believe ultimately he may be a candidate for a lateral tibial subchondroplasty with intra-articular injection of the back from an iliac crest harvest.  That being said I did describe that given the fact that his arthritis has progressed medially that he may not be the perfect candidate for this procedure although ultimately this is more of an arthroscopic and minimally invasive procedure.  I did discuss that his knee is quite complex as pain is not his predominant driver or functional limitations and swelling as he attempts to be active which I do believe may be a function of his bone bruise and why he may be a candidate for a subchondroplasty.  I did review the literature regarding that.  At this time he will further consider his treatment options and send me an epic message with further considerations   I personally saw and evaluated the patient, and participated in the management and treatment plan.  Elspeth Parker, MD Attending Physician, Orthopedic Surgery  This document was dictated using Dragon voice recognition software. A reasonable attempt at proof reading has been made to minimize errors.

## 2023-06-12 ENCOUNTER — Ambulatory Visit (HOSPITAL_BASED_OUTPATIENT_CLINIC_OR_DEPARTMENT_OTHER): Payer: BC Managed Care – PPO

## 2023-06-12 ENCOUNTER — Encounter (HOSPITAL_BASED_OUTPATIENT_CLINIC_OR_DEPARTMENT_OTHER): Payer: Self-pay

## 2023-06-12 DIAGNOSIS — G8929 Other chronic pain: Secondary | ICD-10-CM | POA: Diagnosis not present

## 2023-06-12 DIAGNOSIS — R600 Localized edema: Secondary | ICD-10-CM | POA: Diagnosis not present

## 2023-06-12 DIAGNOSIS — M25561 Pain in right knee: Secondary | ICD-10-CM | POA: Diagnosis not present

## 2023-06-12 DIAGNOSIS — M6281 Muscle weakness (generalized): Secondary | ICD-10-CM | POA: Diagnosis not present

## 2023-06-12 DIAGNOSIS — R262 Difficulty in walking, not elsewhere classified: Secondary | ICD-10-CM | POA: Diagnosis not present

## 2023-06-12 NOTE — Therapy (Signed)
OUTPATIENT PHYSICAL THERAPY LOWER EXTREMITY TREATMENT   Patient Name: Corey Lynch MRN: [id number] DOB:1960/08/03, 63 y.o., male Today's Date: 06/12/2023  END OF SESSION:  PT End of Session - 06/12/23 0850     Visit Number 8    Number of Visits 16    Date for PT Re-Evaluation 09/02/23    Authorization Type BLUE CROSS BLUE SHIELD    PT Start Time 0803    PT Stop Time 0847    PT Time Calculation (min) 44 min    Activity Tolerance Patient tolerated treatment well    Behavior During Therapy [facility] for tasks assessed/performed                  History reviewed. No pertinent past medical history. History reviewed. No pertinent surgical history. Patient Active Problem List   Diagnosis Date Noted   Primary osteoarthritis of right knee 04/20/2023   Chronic pain of right knee 04/20/2023   Pseudogout of right knee 04/20/2023   Acute lateral meniscal tear, right, initial encounter 07/01/2022   Degenerative disc disease, cervical 10/11/2019   Nonallopathic lesion of cervical region 10/11/2019   Nonallopathic lesion of thoracic region 10/11/2019    PCP: Darrow Bussing, MD  REFERRING PROVIDER: Marcene Corning, MD   REFERRING DIAG: S/P Right Knee Scope   THERAPY DIAG:  Difficulty in walking, not elsewhere classified  Localized edema  Muscle weakness (generalized)  Rationale for Evaluation and Treatment: Rehabilitation  ONSET DATE: Feb 2024  May 9th and June 27th Scopes  SUBJECTIVE:   SUBJECTIVE STATEMENT:  Pt reports his R knee has continued to pp with every step, mostly non painful. L knee soreness.   Eval: Pt states that he has chronic R knee pain with a history of a meniscal tear and a continuous build of fluid. He states that he was very active prior to onset of pain playing basketball multiple times a week. Pt noted onset of pain in February, with two surgical procedures following. First was a debridement and second was to reduce fluid build up. Since second  surgery, pt has noted ongoing pain and continued fluid build up. He is now unable to play basketball and has trouble walking.    PERTINENT HISTORY: None.  PAIN:  Are you having pain? No: NPRS scale: 0/10 Pain location: R knee  Pain description: Dull ache  Aggravating factors: Walking, playing basketball, sleeping, stairs  Relieving factors: Rest   PRECAUTIONS: None  RED FLAGS: None   WEIGHT BEARING RESTRICTIONS: No  FALLS:  Has patient fallen in last 6 months? No  LIVING ENVIRONMENT: Lives with: lives with their family Lives in: House/apartment Stairs: Yes: Internal: 1 flight steps; on right going up and External: 4-5  steps; bilateral but cannot reach both Has following equipment at home: None  OCCUPATION: VP of sales.   PLOF: Independent  PATIENT GOALS: Pt would like to get back to playing basketball without significant pain and swelling.   OBJECTIVE:  Note: Objective measures were completed at Evaluation unless otherwise noted.  DIAGNOSTIC FINDINGS: OA, meniscal tear and swelling.   PATIENT SURVEYS:  FOTO 57.36% 70% predicted.  1/9 FOTO 64%  COGNITION: Overall cognitive status: Within functional limits for tasks assessed     SENSATION: WFL  EDEMA:  Circumferential: R 41 cm, L 41cm  POSTURE: No Significant postural limitations  PALPATION: No tenderness to palpation.   LOWER EXTREMITY ROM:  Active ROM Right eval Left eval R  1/9  Hip flexion [facility]   Hip extension  [facility] WFL   Knee flexion [facility] WFL 122  Knee extension A:-20 P: -10 A: -5 -1   (Blank rows = not tested)  LOWER EXTREMITY MMT:  MMT Right eval R 1/9 Left eval  Hip flexion 4/5 4+/5 5/5  Knee flexion 4/5 4+/5 5/5  Knee extension 4-/5 severe pain 4/5 5/5   (Blank rows = not tested)   FUNCTIONAL TESTS:  5 times sit to stand: 10.9 sec   GAIT: Distance walked: 90ft  Assistive device utilized: None Level of assistance: Complete Independence Comments:WFL, mild R knee flexion,  lack of TKE   TODAY'S TREATMENT:                                                                                                                              DATE:  1/17  Patella mobilizations, STM lateral distal HS Tib/fem mobilizations in prone and supine posterior and anterior glides grade II-III, tib/fem mobilizations with tib ER Supine sLR 3x10 SLR with ER 2x10 Long sit HSS 30sec x3 R Stand to sit staggered 2x5 Single leg bridge 3" 2x10 R Side stepping with squat- Blue TB at ankles x4 laps at rail  1/9  Recumbent bike warm up lvl 1 5 min Bosu step up 3x12 Wall lunge iso holds 2x5 15s SLS squat to table 3x8  STM to L HS and calf group   12/30 Dbl, Staggered Leg Press 100lbs 2 up one down heel raises 50 lbs on leg press Squats with black theraband pull ups.  Trigger Point Dry-Needling  Treatment instructions: Expect mild to moderate muscle soreness. S/S of pneumothorax if dry needled over a lung field, and to seek immediate medical attention should they occur. Patient verbalized understanding of these instructions and education. Patient Consent Given: Yes Education handout provided: No Muscles treated: R quad,  HS, lateral gastroc  Electrical stimulation performed: Yes Parameters: low amplitude, mod intensity.  Treatment response/outcome: No adverse effects, multiple twitch responses noted.  Prone contract relax to quads.    12/23: - Seated Hamstring Stretch  - 2 x daily - 7 x weekly - 1 sets - 3 reps - 30 hold - Prone Knee Extension with 4lb Ankle Weight - added eccentric control, bilat - 1 x daily - 7 x weekly - 1 sets - 1 reps - 3-5 min hold - Staggered Leg Press 65 - 1 x daily - 2-3 x weekly - 3 sets - 8 reps - Resisted hip extension in half kneeling.  - Trigger Point Dry-Needling  Treatment instructions: Expect mild to moderate muscle soreness. S/S of pneumothorax if dry needled over a lung field, and to seek immediate medical attention should they occur.  Patient verbalized understanding of these instructions and education. Patient Consent Given: Yes Education handout provided: No Muscles treated: R quad  Electrical stimulation performed: Yes Parameters: low amplitude, mod intensity.  Treatment response/outcome: No adverse effects, multiple twitch responses noted.  Prone contract relax to quads.   12/16  Exercises - Seated Hamstring Stretch  - 2 x daily - 7 x weekly - 1 sets - 3 reps - 30 hold - Prone Knee Extension with 2lb Ankle Weight  - 1 x daily - 7 x weekly - 1 sets - 1 reps - 3-5 min hold - Wall Lunge iso holds - 1 x daily - 2-3 x weekly - 6x 10s - Staggered Leg Press 65lbs - 1 x daily - 2-3 x weekly - 3 sets - 8 reps - Single Leg Knee Extension with Weight Machine  10lbs - 1 x daily - 2-3 x weekly - 3 sets - 10 reps - Standing Bilateral Heel Raise on Step  - 1 x daily - 2-3 x weekly - 2 sets - 20 reps  Exercise progression regression; activity demands of basketball and skiing  12/2  Edema sweeping  R knee TKE mob grade IV (tibial ER)  LAD with quad set 15x 10lbs LAD 3x10 2s Quad set with heel prop 2s 2x10 Compression; focus of TKE, swelling management with activiity, potential effects of injections and surgical indications   11/6  Edema sweeping  R knee TKE mob grade IV (tibial ER)  Quad set with heel prop 2s 2x10 TKE GTB 3x10 Blue TB knee ext 3x10   PATIENT EDUCATION:  Education details: Educated pt on anatomy and physiology of current symptoms, FOTO, diagnosis, prognosis, HEP,  and POC. Person educated: Patient Education method: Medical illustrator Education comprehension: verbalized understanding and returned demonstration  HOME EXERCISE PROGRAM: Access Code: [email or web address] URL: [email or web address] Date: 03/28/2023 Prepared by: Royal Hawthorn  Exercises - Seated Hamstring Stretch  - 2 x daily - 7 x weekly - 2 sets - 10 reps - Supine Quad Set  - 2 x daily - 7 x weekly - 2 sets - 10  reps - 5 hold   ASSESSMENT:  CLINICAL IMPRESSION:  Pt did report some relief and improved mobility following tib/fem joint mobilizations. Popping felt by pt and PTA with palpation with staggered stand to sit when knee reached ~65deg flexion. This also occurred with leg press around the same degree of flexion. Non painful. Did not change much with manual patellar gliding/stabilization. Pt very tight into R HS and calf. Spent time stretching these with improved mobility noted. Fatigued with lateral sidestepping in squat position with resistance, though no pain in knee.  Reported mild medial knee discomfort at end of session. Will monitor. Pt states he plans to ski next week while on family trip. Instructed him to avoid pain and monitor weakness.    OBJECTIVE IMPAIRMENTS: decreased activity tolerance, difficulty walking, decreased balance, decreased endurance, decreased mobility, decreased ROM, decreased strength, impaired flexibility, impaired UE/LE use, postural dysfunction, and pain.  ACTIVITY LIMITATIONS: bending, lifting, carry, locomotion, cleaning, community activity, driving, and or occupation  PERSONAL FACTORS: chronic knee pain are also affecting patient's functional outcome.  REHAB POTENTIAL: Good  CLINICAL DECISION MAKING: Evolving/moderate complexity  EVALUATION COMPLEXITY: Moderate   GOALS: Short term PT Goals Target date: 05/09/2024 Pt will be I and compliant with HEP. Baseline:  Goal status: ongoing Pt will decrease pain by 25% overall Baseline: Goal status: MET  Long term PT goals Target date: 08/27/2023  Pt will improve ROM to [facility] to improve functional mobility Baseline: Goal status: MET Pt will improve  hip/knee strength to at least 5-/5 MMT to improve functional strength Baseline: Goal status: ongoing Pt will improve FOTO to at least 73% functional to show improved function Baseline:  Goal status: ongoing Pt  will report 6-8 hours of consecutive sleep without  disruption due to pain.  Baseline:  Goal status: MET Pt will reduce pain to overall less than 2-3/10 with usual recreational activities. Baseline: Goal status: ongoing Pt will be able to ambulate community distances at least 1000 ft WNL gait pattern without complaints Baseline: Goal status: ongoing  PLAN: PT FREQUENCY: 1-2 times per week   PT DURATION: 12 weeks  PLANNED INTERVENTIONS (unless contraindicated): aquatic PT, Canalith repositioning, cryotherapy, Electrical stimulation, Iontophoresis with 4 mg/ml dexamethasome, Moist heat, traction, Ultrasound, gait training, Therapeutic exercise, balance training, neuromuscular re-education, patient/family education, prosthetic training, manual techniques, passive ROM, dry needling, taping, vasopnuematic device, vestibular, spinal manipulations, joint manipulations  PLAN FOR NEXT SESSION: Assess HEP/update PRN, stretches focusing on IT and lateral HS. Strengthening proximal hip musculature. Progress with dynamic movements and gait as tolerated. R knee TKE ROM   Donnel Saxon Keiland Pickering, PTA 06/12/2023, 9:20 AM

## 2023-06-15 ENCOUNTER — Encounter (HOSPITAL_BASED_OUTPATIENT_CLINIC_OR_DEPARTMENT_OTHER): Payer: Self-pay | Admitting: Physical Therapy

## 2023-06-15 ENCOUNTER — Ambulatory Visit (HOSPITAL_BASED_OUTPATIENT_CLINIC_OR_DEPARTMENT_OTHER): Payer: BC Managed Care – PPO | Admitting: Physical Therapy

## 2023-06-15 DIAGNOSIS — R600 Localized edema: Secondary | ICD-10-CM | POA: Diagnosis not present

## 2023-06-15 DIAGNOSIS — R262 Difficulty in walking, not elsewhere classified: Secondary | ICD-10-CM | POA: Diagnosis not present

## 2023-06-15 DIAGNOSIS — M6281 Muscle weakness (generalized): Secondary | ICD-10-CM | POA: Diagnosis not present

## 2023-06-15 DIAGNOSIS — G8929 Other chronic pain: Secondary | ICD-10-CM

## 2023-06-15 DIAGNOSIS — M25561 Pain in right knee: Secondary | ICD-10-CM | POA: Diagnosis not present

## 2023-06-15 NOTE — Therapy (Signed)
OUTPATIENT PHYSICAL THERAPY LOWER EXTREMITY TREATMENT   Patient Name: Corey Lynch MRN: [id number] DOB:[birth year], 63 y.o., male Today's Date: 06/15/2023  END OF SESSION:  PT End of Session - 06/15/23 0852     Visit Number 9    Number of Visits 16    Date for PT Re-Evaluation 09/02/23    Authorization Type BLUE CROSS BLUE SHIELD    PT Start Time 0847    PT Stop Time 0926    PT Time Calculation (min) 39 min    Activity Tolerance Patient tolerated treatment well    Behavior During Therapy [facility] for tasks assessed/performed                  History reviewed. No pertinent past medical history. History reviewed. No pertinent surgical history. Patient Active Problem List   Diagnosis Date Noted   Primary osteoarthritis of right knee 04/20/2023   Chronic pain of right knee 04/20/2023   Pseudogout of right knee 04/20/2023   Acute lateral meniscal tear, right, initial encounter 07/01/2022   Degenerative disc disease, cervical 10/11/2019   Nonallopathic lesion of cervical region 10/11/2019   Nonallopathic lesion of thoracic region 10/11/2019    PCP: Darrow Bussing, MD  REFERRING PROVIDER: Marcene Corning, MD   REFERRING DIAG: S/P Right Knee Scope   THERAPY DIAG:  Difficulty in walking, not elsewhere classified  Localized edema  Muscle weakness (generalized)  Chronic pain of right knee  Rationale for Evaluation and Treatment: Rehabilitation  ONSET DATE: Feb 2024  May 9th and June 27th Scopes  SUBJECTIVE:   SUBJECTIVE STATEMENT:  Pt states the knee popping is still continues with every step. Pt is concerned with the knee maybe buckling.   Eval: Pt states that he has chronic R knee pain with a history of a meniscal tear and a continuous build of fluid. He states that he was very active prior to onset of pain playing basketball multiple times a week. Pt noted onset of pain in February, with two surgical procedures following. First was a debridement and second  was to reduce fluid build up. Since second surgery, pt has noted ongoing pain and continued fluid build up. He is now unable to play basketball and has trouble walking.    PERTINENT HISTORY: None.  PAIN:  Are you having pain? No: NPRS scale: 0/10 Pain location: R knee  Pain description: Dull ache  Aggravating factors: Walking, playing basketball, sleeping, stairs  Relieving factors: Rest   PRECAUTIONS: None  RED FLAGS: None   WEIGHT BEARING RESTRICTIONS: No  FALLS:  Has patient fallen in last 6 months? No  LIVING ENVIRONMENT: Lives with: lives with their family Lives in: House/apartment Stairs: Yes: Internal: 1 flight steps; on right going up and External: 4-5  steps; bilateral but cannot reach both Has following equipment at home: None  OCCUPATION: VP of sales.   PLOF: Independent  PATIENT GOALS: Pt would like to get back to playing basketball without significant pain and swelling.   OBJECTIVE:  Note: Objective measures were completed at Evaluation unless otherwise noted.  DIAGNOSTIC FINDINGS: OA, meniscal tear and swelling.   PATIENT SURVEYS:  FOTO 57.36% 70% predicted.  1/9 FOTO 64%  COGNITION: Overall cognitive status: Within functional limits for tasks assessed     SENSATION: WFL  EDEMA:  Circumferential: R 41 cm, L 41cm  POSTURE: No Significant postural limitations  PALPATION: No tenderness to palpation.   LOWER EXTREMITY ROM:  Active ROM Right eval Left eval R  1/9  Hip  flexion [facility]   Hip extension [facility]   Knee flexion [facility] WFL 122  Knee extension A:-20 P: -10 A: -5 -1   (Blank rows = not tested)  LOWER EXTREMITY MMT:  MMT Right eval R 1/9 Left eval  Hip flexion 4/5 4+/5 5/5  Knee flexion 4/5 4+/5 5/5  Knee extension 4-/5 severe pain 4/5 5/5   (Blank rows = not tested)   FUNCTIONAL TESTS:  5 times sit to stand: 10.9 sec   GAIT: Distance walked: 87ft  Assistive device utilized: None Level of assistance: Complete  Independence Comments:WFL, mild R knee flexion, lack of TKE   TODAY'S TREATMENT:                                                                                                                              DATE: 1/20  Recumbent bike warm up lvl 1 5 min  Bosu narrow stance shifts 3x20  Bosu step up 3x12 Bosu squat 3x10 Shuttle leg press 3x15 50lbs single leg on R SL HR 3x20    1/17  Patella mobilizations, STM lateral distal HS Tib/fem mobilizations in prone and supine posterior and anterior glides grade II-III, tib/fem mobilizations with tib ER Supine sLR 3x10 SLR with ER 2x10 Long sit HSS 30sec x3 R Stand to sit staggered 2x5 Single leg bridge 3" 2x10 R Side stepping with squat- Blue TB at ankles x4 laps at rail  1/9  Recumbent bike warm up lvl 1 5 min Bosu step up 3x12 Wall lunge iso holds 2x5 15s SLS squat to table 3x8  STM to L HS and calf group   12/30 Dbl, Staggered Leg Press 100lbs 2 up one down heel raises 50 lbs on leg press Squats with black theraband pull ups.  Trigger Point Dry-Needling  Treatment instructions: Expect mild to moderate muscle soreness. S/S of pneumothorax if dry needled over a lung field, and to seek immediate medical attention should they occur. Patient verbalized understanding of these instructions and education. Patient Consent Given: Yes Education handout provided: No Muscles treated: R quad,  HS, lateral gastroc  Electrical stimulation performed: Yes Parameters: low amplitude, mod intensity.  Treatment response/outcome: No adverse effects, multiple twitch responses noted.  Prone contract relax to quads.    12/23: - Seated Hamstring Stretch  - 2 x daily - 7 x weekly - 1 sets - 3 reps - 30 hold - Prone Knee Extension with 4lb Ankle Weight - added eccentric control, bilat - 1 x daily - 7 x weekly - 1 sets - 1 reps - 3-5 min hold - Staggered Leg Press 65 - 1 x daily - 2-3 x weekly - 3 sets - 8 reps - Resisted hip extension in half  kneeling.  - Trigger Point Dry-Needling  Treatment instructions: Expect mild to moderate muscle soreness. S/S of pneumothorax if dry needled over a lung field, and to seek immediate medical attention should they occur. Patient verbalized understanding of  these instructions and education. Patient Consent Given: Yes Education handout provided: No Muscles treated: R quad  Electrical stimulation performed: Yes Parameters: low amplitude, mod intensity.  Treatment response/outcome: No adverse effects, multiple twitch responses noted.  Prone contract relax to quads.   12/16  Exercises - Seated Hamstring Stretch  - 2 x daily - 7 x weekly - 1 sets - 3 reps - 30 hold - Prone Knee Extension with 2lb Ankle Weight  - 1 x daily - 7 x weekly - 1 sets - 1 reps - 3-5 min hold - Wall Lunge iso holds - 1 x daily - 2-3 x weekly - 6x 10s - Staggered Leg Press 65lbs - 1 x daily - 2-3 x weekly - 3 sets - 8 reps - Single Leg Knee Extension with Weight Machine  10lbs - 1 x daily - 2-3 x weekly - 3 sets - 10 reps - Standing Bilateral Heel Raise on Step  - 1 x daily - 2-3 x weekly - 2 sets - 20 reps  Exercise progression regression; activity demands of basketball and skiing  12/2  Edema sweeping  R knee TKE mob grade IV (tibial ER)  LAD with quad set 15x 10lbs LAD 3x10 2s Quad set with heel prop 2s 2x10 Compression; focus of TKE, swelling management with activiity, potential effects of injections and surgical indications   11/6  Edema sweeping  R knee TKE mob grade IV (tibial ER)  Quad set with heel prop 2s 2x10 TKE GTB 3x10 Blue TB knee ext 3x10   PATIENT EDUCATION:  Education details: Educated pt on anatomy and physiology of current symptoms, FOTO, diagnosis, prognosis, HEP,  and POC. Person educated: Patient Education method: Medical illustrator Education comprehension: verbalized understanding and returned demonstration  HOME EXERCISE PROGRAM: Access Code: [email or web address] URL:  [email or web address] Date: 03/28/2023 Prepared by: Royal Hawthorn  Exercises - Seated Hamstring Stretch  - 2 x daily - 7 x weekly - 2 sets - 10 reps - Supine Quad Set  - 2 x daily - 7 x weekly - 2 sets - 10 reps - 5 hold   ASSESSMENT:  CLINICAL IMPRESSION:  Pt able to tolerate increase in volume for more muscle endurance today as he prepares for family vacation. HEP updated for more SL strength and endurance. No pain noted during session but medial knee crepitus does cause apprehension with activity. Plan to continue with strengthening and rehab once pt returns. Pt would benefit from continued skilled therapy in order to reach goals and maximize functional R LE strength and ROM for prevention of further functional decline.   OBJECTIVE IMPAIRMENTS: decreased activity tolerance, difficulty walking, decreased balance, decreased endurance, decreased mobility, decreased ROM, decreased strength, impaired flexibility, impaired UE/LE use, postural dysfunction, and pain.  ACTIVITY LIMITATIONS: bending, lifting, carry, locomotion, cleaning, community activity, driving, and or occupation  PERSONAL FACTORS: chronic knee pain are also affecting patient's functional outcome.  REHAB POTENTIAL: Good  CLINICAL DECISION MAKING: Evolving/moderate complexity  EVALUATION COMPLEXITY: Moderate   GOALS: Short term PT Goals Target date: 05/09/2024 Pt will be I and compliant with HEP. Baseline:  Goal status: ongoing Pt will decrease pain by 25% overall Baseline: Goal status: MET  Long term PT goals Target date: 08/27/2023  Pt will improve ROM to [facility] to improve functional mobility Baseline: Goal status: MET Pt will improve  hip/knee strength to at least 5-/5 MMT to improve functional strength Baseline: Goal status: ongoing Pt will improve FOTO to at least 73% functional to show improved  function Baseline:  Goal status: ongoing Pt will report 6-8 hours of consecutive sleep without  disruption due to pain.  Baseline:  Goal status: MET Pt will reduce pain to overall less than 2-3/10 with usual recreational activities. Baseline: Goal status: ongoing Pt will be able to ambulate community distances at least 1000 ft WNL gait pattern without complaints Baseline: Goal status: ongoing  PLAN: PT FREQUENCY: 1-2 times per week   PT DURATION: 12 weeks  PLANNED INTERVENTIONS (unless contraindicated): aquatic PT, Canalith repositioning, cryotherapy, Electrical stimulation, Iontophoresis with 4 mg/ml dexamethasome, Moist heat, traction, Ultrasound, gait training, Therapeutic exercise, balance training, neuromuscular re-education, patient/family education, prosthetic training, manual techniques, passive ROM, dry needling, taping, vasopnuematic device, vestibular, spinal manipulations, joint manipulations  PLAN FOR NEXT SESSION: Assess HEP/update PRN, stretches focusing on IT and lateral HS. Strengthening proximal hip musculature. Progress with dynamic movements and gait as tolerated. R knee TKE ROM   Zebedee Iba, PT 06/15/2023, 9:29 AM

## 2023-06-26 NOTE — Telephone Encounter (Signed)
VOB initiated for 2025 coverage of Gelsyn.

## 2023-06-29 NOTE — Telephone Encounter (Signed)
Prior Auth for [employer] APPROVED PA# [id number] Valid:

## 2023-06-29 NOTE — Telephone Encounter (Signed)
 Medical Buy and Annette Stable - Prior Authorization REQUIRED  Pharmacy Benefit - Prior Authorization REQUIRED

## 2023-06-29 NOTE — Telephone Encounter (Addendum)
Pharmacy Benefit - [employer]  Prior Authorization initiated for [facility] via [email or web address] KEY: [id number]

## 2023-06-29 NOTE — Telephone Encounter (Signed)
Prior Auth initiated via [email or web address] PA# [id number]

## 2023-06-30 NOTE — Telephone Encounter (Signed)
 GELSYN for RIGHT knee OA   Medical Benefit  Primary Insurance: [employer] PPO Co-pay: $50 Co-insurance: 20% Deductible: $1500 of $1500 met Prior Auth: APPROVED [id number] Valid 06/29/23-05/25/24  Pharmacy Benefit  Primary Insurance: [employer] [employer] Co-pay: $80 Co-insurance:  Deductible:  Prior Auth: APPROVED PA Case ID #: [id number] Valid: 04/30/2023 to 04/29/2024   Knee Injection History 07/01/22 - Kenalog  RIGHT 07/18/22 - Kenalog  RIGHT 08/14/22 - Kenalog  RIGHT 04/30/23 - Zilretta  RIGHT

## 2023-06-30 NOTE — Telephone Encounter (Signed)
Holding until needed. Zilretta given on 1/5.

## 2023-07-03 NOTE — Telephone Encounter (Signed)
 VOB initiated for Zilretta  for RIGHT knee dOA.

## 2023-07-06 ENCOUNTER — Other Ambulatory Visit: Payer: Self-pay | Admitting: Emergency Medicine

## 2023-07-06 DIAGNOSIS — F17210 Nicotine dependence, cigarettes, uncomplicated: Secondary | ICD-10-CM

## 2023-07-06 DIAGNOSIS — Z87891 Personal history of nicotine dependence: Secondary | ICD-10-CM

## 2023-07-06 DIAGNOSIS — Z122 Encounter for screening for malignant neoplasm of respiratory organs: Secondary | ICD-10-CM

## 2023-07-06 NOTE — Telephone Encounter (Signed)
 ZILRETTA   Medical Buy and Raenette Bumps - Prior Authorization NOT required  Pharmacy Benefit - [employer] - Prior Authorization REQUIRED

## 2023-07-07 NOTE — Telephone Encounter (Signed)
Prior Authorization initiated for [facility] via [email or web address] KEY: [id number]

## 2023-07-08 NOTE — Telephone Encounter (Signed)
Zilretta for RIGHT knee OA  OK to schedule on or after 07/24/23  Medical Benefit  Primary Insurance: [employer] [place] [employer] Co-pay: $50 Co-insurance: 20% Deductible: $1500 of $1500 met Prior Auth: NOT required  Pharmacy Benefit  PBM: [employer] Co-pay: undisclosed Co-insurance: undisclosed Deductible: undisclosed Prior Auth: DENIED   Knee Injection History 07/18/22 - Kenalog RIGHT 08/14/22 - Kenalog RIGHT 04/30/23 - Zilretta RIGHT

## 2023-07-08 NOTE — Telephone Encounter (Signed)
Pharmacy Benefit  Prior Auth for Zilretta DENIED  Your plan only covers this drug when you meet one of these options: A) You have tried other drugs your plan covers (preferred drugs), and they did not work well for you, or B) Your doctor gives [place] a medical reason you cannot take those other drugs. For your plan, you may need to try up to three preferred drugs. We have denied your request because you do not meet any of these conditions. We reviewed the information we had. Your request has been denied. Your doctor can send [place] any new or missing information for [place] to review. The preferred drugs for your plan are: triamcinolone acetonide injection, methylprednisolone acetate injection.

## 2023-07-08 NOTE — Telephone Encounter (Signed)
Holding until needed by patient.  *confirm we have supply when scheduling*

## 2023-07-14 ENCOUNTER — Telehealth: Payer: Self-pay

## 2023-07-14 NOTE — Telephone Encounter (Signed)
 Received a fax response regarding Zilretta authorization.   This has been denied due to patient needing to try Triamcinolone acetonide and methylprednisolone acetate.   Was not sure what step you wanted to take next.  Schedule for the methylprednisolone injection  Or Have me run for Gel

## 2023-07-14 NOTE — Telephone Encounter (Signed)
 Forwarding to Dr. Denyse Amass to review and advise.

## 2023-07-16 NOTE — Telephone Encounter (Signed)
 Forwarding to Bri. He may already for set up for Gelsyn.

## 2023-07-16 NOTE — Telephone Encounter (Signed)
 Lets get him set up for gel injections.

## 2023-08-06 NOTE — Telephone Encounter (Signed)
 Ordered. Holding until supply arrives for scheduling.

## 2023-08-10 NOTE — Telephone Encounter (Signed)
 Left message for patient to call back to schedule. I have one labeled for him.

## 2023-08-14 ENCOUNTER — Ambulatory Visit
Admission: RE | Admit: 2023-08-14 | Discharge: 2023-08-14 | Disposition: A | Discharge status: Home / Self Care | Payer: BC Managed Care – PPO | Source: Ambulatory Visit | Attending: Acute Care | Admitting: Acute Care

## 2023-08-14 ENCOUNTER — Ambulatory Visit (HOSPITAL_BASED_OUTPATIENT_CLINIC_OR_DEPARTMENT_OTHER)

## 2023-08-14 ENCOUNTER — Ambulatory Visit (HOSPITAL_BASED_OUTPATIENT_CLINIC_OR_DEPARTMENT_OTHER): Admitting: Orthopaedic Surgery

## 2023-08-14 DIAGNOSIS — M25562 Pain in left knee: Secondary | ICD-10-CM

## 2023-08-14 DIAGNOSIS — G8929 Other chronic pain: Secondary | ICD-10-CM

## 2023-08-14 DIAGNOSIS — F17210 Nicotine dependence, cigarettes, uncomplicated: Secondary | ICD-10-CM

## 2023-08-14 DIAGNOSIS — Z122 Encounter for screening for malignant neoplasm of respiratory organs: Secondary | ICD-10-CM

## 2023-08-14 DIAGNOSIS — Z87891 Personal history of nicotine dependence: Secondary | ICD-10-CM

## 2023-08-14 DIAGNOSIS — M25462 Effusion, left knee: Secondary | ICD-10-CM | POA: Diagnosis not present

## 2023-08-14 NOTE — Progress Notes (Signed)
 Chief Complaint: left knee pain     History of Present Illness:   08/14/2023: Presents today with ongoing left knee pain after a recent ski trip.  He has been experiencing medial based pain with increased swelling.  This has been a new and ongoing issue after the ski trip.  Endorses some clicking and popping  Corey Lynch is a 63 y.o. male presents today for continued right knee pain in the setting of previous meniscal debridement.  He states that he is status post medial lateral meniscal debridement with Dr. Jerl Santos in May which was then shortly followed by an additional arthroscopy 7 weeks after.  There was no significant relief following this.  He has recently undergone a Zilretta injection with Dr. Denyse Amass in December of this year which is giving him some relief and improved his swelling.  He was diagnosed with pseudogout for which the knee was previously aspirated as well.  He enjoys being active playing basketball.  He is at this point hoping to avoid a total knee arthroplasty.    PMH/PSH/Family History/Social History/Meds/Allergies:   No past medical history on file. No past surgical history on file. Social History   Socioeconomic History   Marital status: Married    Spouse name: Not on file   Number of children: Not on file   Years of education: Not on file   Highest education level: Not on file  Occupational History   Not on file  Tobacco Use   Smoking status: Every Day    Current packs/day: 0.25    Types: Cigarettes   Smokeless tobacco: Current   Tobacco comments:    smoking 1/3 pack daily 06/04/2020  Substance and Sexual Activity   Alcohol use: Yes   Drug use: Never   Sexual activity: Not on file  Other Topics Concern   Not on file  Social History Narrative   Not on file   Social Drivers of Health   Financial Resource Strain: Not on file  Food Insecurity: Not on file  Transportation Needs: Not on file  Physical Activity: Not on file  Stress: Not on file   Social Connections: Not on file   Family History  Problem Relation Age of Onset   Basal cell carcinoma Brother    Heart attack Mother    No Known Allergies Current Outpatient Medications  Medication Sig Dispense Refill   atorvastatin (LIPITOR) 20 MG tablet 1 tablet     colchicine 0.6 MG tablet TAKE 1 TABLET BY MOUTH EVERY DAY Oral for 90 Days     hydroxychloroquine (PLAQUENIL) 200 MG tablet Take 400 mg by mouth daily.     sodium hyaluronate, viscosup, (GELSYN-3) 16.8 MG/2ML SOSY intra-articular injection Dr. Denyse Amass to inj 2 mL, intra-articular, right knee, once weekly x 3 weeks 6 mL 0   No current facility-administered medications for this visit.   No results found.  Review of Systems:   A ROS was performed including pertinent positives and negatives as documented in the HPI.  Physical Exam :   Constitutional: NAD and appears stated age Neurological: Alert and oriented Psych: Appropriate affect and cooperative There were no vitals taken for this visit.   Comprehensive Musculoskeletal Exam:    Left knee knee with tenderness about the medial joint line.  Positive McMurray medially.  There is no obvious crepitus.  Negative Lachman, negative posterior drawer.  No laxity with varus or valgus.   Imaging:   Xray (4 views left knee): Real tibiofemoral joint space narrowing  without evidence of osteoarthritis    I personally reviewed and interpreted the radiographs.   Assessment and Plan:   63 y.o. male with acute left knee pain with difficulty bearing weight, swelling and pain about the medial joint line with positive McMurray.  At this time I would like to obtain an MRI of the left knee so that we can rule out an underlying meniscal root type injury.  I will plan to see back following we will discuss results  -Plan for MRI left knee and follow-up to discuss results   I personally saw and evaluated the patient, and participated in the management and treatment plan.  Huel Cote, MD Attending Physician, Orthopedic Surgery  This document was dictated using Dragon voice recognition software. A reasonable attempt at proof reading has been made to minimize errors.

## 2023-08-20 ENCOUNTER — Ambulatory Visit
Admission: RE | Admit: 2023-08-20 | Discharge: 2023-08-20 | Disposition: A | Discharge status: Home / Self Care | Source: Ambulatory Visit | Attending: Orthopaedic Surgery

## 2023-08-20 DIAGNOSIS — G8929 Other chronic pain: Secondary | ICD-10-CM

## 2023-08-20 DIAGNOSIS — M1712 Unilateral primary osteoarthritis, left knee: Secondary | ICD-10-CM | POA: Diagnosis not present

## 2023-08-20 DIAGNOSIS — M23322 Other meniscus derangements, posterior horn of medial meniscus, left knee: Secondary | ICD-10-CM | POA: Diagnosis not present

## 2023-08-20 DIAGNOSIS — M25562 Pain in left knee: Secondary | ICD-10-CM | POA: Diagnosis not present

## 2023-09-03 ENCOUNTER — Ambulatory Visit (INDEPENDENT_AMBULATORY_CARE_PROVIDER_SITE_OTHER): Admitting: Orthopaedic Surgery

## 2023-09-03 ENCOUNTER — Ambulatory Visit (INDEPENDENT_AMBULATORY_CARE_PROVIDER_SITE_OTHER)

## 2023-09-03 DIAGNOSIS — M8440XA Pathological fracture, unspecified site, initial encounter for fracture: Secondary | ICD-10-CM

## 2023-09-03 DIAGNOSIS — S83242A Other tear of medial meniscus, current injury, left knee, initial encounter: Secondary | ICD-10-CM | POA: Diagnosis not present

## 2023-09-03 MED ORDER — TRIAMCINOLONE ACETONIDE 40 MG/ML IJ SUSP
80.0000 mg | INTRAMUSCULAR | Status: AC | PRN
  Administered 2023-09-03: 80 mg via INTRA_ARTICULAR

## 2023-09-03 MED ORDER — LIDOCAINE HCL 1 % IJ SOLN
4.0000 mL | INTRAMUSCULAR | Status: AC | PRN
  Administered 2023-09-03: 4 mL

## 2023-09-03 NOTE — Progress Notes (Signed)
 Chief Complaint: left knee pain     History of Present Illness:   09/03/2023: Presents today for MRI follow-up of the left knee.  Corey Lynch is a 63 y.o. male presents today for continued right knee pain in the setting of previous meniscal debridement.  He states that he is status post medial lateral meniscal debridement with Dr. Jerl Santos in May which was then shortly followed by an additional arthroscopy 7 weeks after.  There was no significant relief following this.  He has recently undergone a Zilretta injection with Dr. Denyse Amass in December of this year which is giving him some relief and improved his swelling.  He was diagnosed with pseudogout for which the knee was previously aspirated as well.  He enjoys being active playing basketball.  He is at this point hoping to avoid a total knee arthroplasty.    PMH/PSH/Family History/Social History/Meds/Allergies:   No past medical history on file. No past surgical history on file. Social History   Socioeconomic History  . Marital status: Married    Spouse name: Not on file  . Number of children: Not on file  . Years of education: Not on file  . Highest education level: Not on file  Occupational History  . Not on file  Tobacco Use  . Smoking status: Every Day    Current packs/day: 0.25    Types: Cigarettes  . Smokeless tobacco: Current  . Tobacco comments:    smoking 1/3 pack daily 06/04/2020  Substance and Sexual Activity  . Alcohol use: Yes  . Drug use: Never  . Sexual activity: Not on file  Other Topics Concern  . Not on file  Social History Narrative  . Not on file   Social Drivers of Health   Financial Resource Strain: Not on file  Food Insecurity: Not on file  Transportation Needs: Not on file  Physical Activity: Not on file  Stress: Not on file  Social Connections: Not on file   Family History  Problem Relation Age of Onset  . Basal cell carcinoma Brother   . Heart attack Mother    No Known  Allergies Current Outpatient Medications  Medication Sig Dispense Refill  . atorvastatin (LIPITOR) 20 MG tablet 1 tablet    . colchicine 0.6 MG tablet TAKE 1 TABLET BY MOUTH EVERY DAY Oral for 90 Days    . hydroxychloroquine (PLAQUENIL) 200 MG tablet Take 400 mg by mouth daily.    . sodium hyaluronate, viscosup, (GELSYN-3) 16.8 MG/2ML SOSY intra-articular injection Dr. Denyse Amass to inj 2 mL, intra-articular, right knee, once weekly x 3 weeks 6 mL 0   No current facility-administered medications for this visit.   No results found.  Review of Systems:   A ROS was performed including pertinent positives and negatives as documented in the HPI.  Physical Exam :   Constitutional: NAD and appears stated age Neurological: Alert and oriented Psych: Appropriate affect and cooperative There were no vitals taken for this visit.   Comprehensive Musculoskeletal Exam:    Left knee knee with tenderness about the medial joint line.  Positive McMurray medially.  There is no obvious crepitus.  Negative Lachman, negative posterior drawer.  No laxity with varus or valgus.   Imaging:   Xray (4 views left knee): Significant tibiofemoral joint space narrowing without evidence of osteoarthritis  MRI left knee: Meniscal root tear variant with medial meniscal extrusion.  Tibial bone bruise with chondral loss involving the medial tibiofemoral joint otherwise preserved lateral and patellofemoral  joints  I personally reviewed and interpreted the radiographs.   Assessment and Plan:   63 y.o. male with acute left knee pain with difficulty bearing weight, swelling in the setting of a medial meniscal root tear which is progressed to medial compartment tibiofemoral osteoarthritis.  I did describe that his limb length alignment views do show a varus deformity with increased weightbearing through the medial joints bilaterally but worse on the left.  I do believe that this was likely the cause of his meniscal root injury  which is now progressed to tibiofemoral osteoarthritis.  Given this he would like to proceed with an ultrasound-guided injection of the left today.  I did discuss the possibility of a partial knee arthroplasty.  He would like to consider this in the fall  - Plan for left knee ultrasound-guided injection after verbal consent obtained    Procedure Note  Patient: Corey Lynch             Date of Birth: 1960/06/08           MRN: [id number]             Visit Date: 09/03/2023  Procedures: Visit Diagnoses:  1. Insufficiency fracture     Large Joint Inj: L knee on 09/03/2023 10:51 AM Indications: pain Details: 22 G 1.5 in needle, ultrasound-guided anterior approach  Arthrogram: No  Medications: 4 mL lidocaine 1 %; 80 mg triamcinolone acetonide 40 MG/ML Outcome: tolerated well, no immediate complications Procedure, treatment alternatives, risks and benefits explained, specific risks discussed. Consent was given by the patient. Immediately prior to procedure a time out was called to verify the correct patient, procedure, equipment, support staff and site/side marked as required. Patient was prepped and draped in the usual sterile fashion.        I personally saw and evaluated the patient, and participated in the management and treatment plan.  Huel Cote, MD Attending Physician, Orthopedic Surgery  This document was dictated using Dragon voice recognition software. A reasonable attempt at proof reading has been made to minimize errors.

## 2023-09-21 ENCOUNTER — Other Ambulatory Visit: Payer: Self-pay | Admitting: Acute Care

## 2023-09-21 DIAGNOSIS — Z87891 Personal history of nicotine dependence: Secondary | ICD-10-CM

## 2023-09-21 DIAGNOSIS — Z122 Encounter for screening for malignant neoplasm of respiratory organs: Secondary | ICD-10-CM

## 2023-12-02 ENCOUNTER — Ambulatory Visit (HOSPITAL_BASED_OUTPATIENT_CLINIC_OR_DEPARTMENT_OTHER): Admitting: Orthopaedic Surgery

## 2023-12-02 ENCOUNTER — Telehealth (HOSPITAL_BASED_OUTPATIENT_CLINIC_OR_DEPARTMENT_OTHER): Payer: Self-pay | Admitting: Orthopaedic Surgery

## 2023-12-02 DIAGNOSIS — S83242A Other tear of medial meniscus, current injury, left knee, initial encounter: Secondary | ICD-10-CM

## 2023-12-02 NOTE — Telephone Encounter (Signed)
 Zilretta  injections

## 2023-12-02 NOTE — Progress Notes (Signed)
 Chief Complaint: left knee pain     History of Present Illness:   12/02/2023: Presents today for further discussion of the left knee.  He did get a few weeks of relief from his previous injection but still has swelling which is bothering him particular with activity.  Does have a history of Zilretta  injection on the right and would like to try this on the left as well.  He is still having bilateral knee swelling and pain.  Corey Lynch is a 63 y.o. male presents today for continued right knee pain in the setting of previous meniscal debridement.  He states that he is status post medial lateral meniscal debridement with Dr. Dalldorf in May which was then shortly followed by an additional arthroscopy 7 weeks after.  There was no significant relief following this.  He has recently undergone a Zilretta  injection with Dr. Joane in December of this year which is giving him some relief and improved his swelling.  He was diagnosed with pseudogout for which the knee was previously aspirated as well.  He enjoys being active playing basketball.  He is at this point hoping to avoid a total knee arthroplasty.    PMH/PSH/Family History/Social History/Meds/Allergies:   No past medical history on file. No past surgical history on file. Social History   Socioeconomic History   Marital status: Married    Spouse name: Not on file   Number of children: Not on file   Years of education: Not on file   Highest education level: Not on file  Occupational History   Not on file  Tobacco Use   Smoking status: Every Day    Current packs/day: 0.25    Types: Cigarettes   Smokeless tobacco: Current   Tobacco comments:    smoking 1/3 pack daily 06/04/2020  Substance and Sexual Activity   Alcohol use: Yes   Drug use: Never   Sexual activity: Not on file  Other Topics Concern   Not on file  Social History Narrative   Not on file   Social Drivers of Health   Financial Resource Strain: Not on file  Food  Insecurity: Not on file  Transportation Needs: Not on file  Physical Activity: Not on file  Stress: Not on file  Social Connections: Not on file   Family History  Problem Relation Age of Onset   Basal cell carcinoma Brother    Heart attack Mother    No Known Allergies Current Outpatient Medications  Medication Sig Dispense Refill   atorvastatin (LIPITOR) 20 MG tablet 1 tablet     colchicine 0.6 MG tablet TAKE 1 TABLET BY MOUTH EVERY DAY Oral for 90 Days     hydroxychloroquine (PLAQUENIL) 200 MG tablet Take 400 mg by mouth daily.     sodium hyaluronate, viscosup, (GELSYN-3) 16.8 MG/2ML SOSY intra-articular injection Dr. Joane to inj 2 mL, intra-articular, right knee, once weekly x 3 weeks 6 mL 0   No current facility-administered medications for this visit.   No results found.  Review of Systems:   A ROS was performed including pertinent positives and negatives as documented in the HPI.  Physical Exam :   Constitutional: NAD and appears stated age Neurological: Alert and oriented Psych: Appropriate affect and cooperative There were no vitals taken for this visit.   Comprehensive Musculoskeletal Exam:    Left knee knee with tenderness about the medial joint line.  Positive McMurray medially.  There is no obvious crepitus.  Negative Lachman, negative posterior  drawer.  No laxity with varus or valgus.  Right knee with tenderness predominately posterior medial with swelling and fullness.  Range of motion is from 0 to 130 degrees.  There is more swelling on the left compared to the right.  Distal neurosensory exam is intact   Imaging:   Xray (4 views left knee): Significant tibiofemoral joint space narrowing without evidence of osteoarthritis  MRI left knee: Meniscal root tear variant with medial meniscal extrusion.  Tibial bone bruise with chondral loss involving the medial tibiofemoral joint otherwise preserved lateral and patellofemoral joints  I personally reviewed and  interpreted the radiographs.   Assessment and Plan:   63 y.o. male with acute left knee pain with difficulty bearing weight, swelling in the setting of a medial meniscal root tear which is progressed to medial compartment tibiofemoral osteoarthritis.  I did describe that his limb length alignment views do show a varus deformity with increased weightbearing through the medial joints bilaterally but worse on the left.  I do believe that this was likely the cause of his meniscal root injury which is now progressed to tibiofemoral osteoarthritis.  Overall he has had effectiveness with Zilretta  injections on the right and at this time I do believe he would benefit from an injection of this into both knees.  We will plan to work on insurance authorization for this.  He is also possibly considering partial knee arthroplasty on the left.  - Plan for follow-up for bilateral Zilretta  knee injections pending authorization      I personally saw and evaluated the patient, and participated in the management and treatment plan.  Elspeth Parker, MD Attending Physician, Orthopedic Surgery  This document was dictated using Dragon voice recognition software. A reasonable attempt at proof reading has been made to minimize errors.

## 2023-12-04 NOTE — Telephone Encounter (Signed)
 VOB submitted for Zilretta, bilateral knee

## 2023-12-07 ENCOUNTER — Telehealth: Payer: Self-pay

## 2023-12-07 NOTE — Telephone Encounter (Signed)
 Noted

## 2023-12-07 NOTE — Telephone Encounter (Signed)
 Please order Zilretta  for bilateral knee.  To be delivered to Dr. Danetta office. Thank you.

## 2023-12-08 ENCOUNTER — Telehealth: Payer: Self-pay

## 2023-12-08 NOTE — Telephone Encounter (Signed)
 Patient would like to know if he could be worked into the schedule on Monday, 12/21/2023 for Zilretta  injection. CB#  [phone].  Please advise.  Thank you.

## 2023-12-09 ENCOUNTER — Other Ambulatory Visit: Payer: Self-pay

## 2023-12-09 ENCOUNTER — Telehealth: Payer: Self-pay

## 2023-12-09 DIAGNOSIS — G8929 Other chronic pain: Secondary | ICD-10-CM

## 2023-12-09 DIAGNOSIS — M1711 Unilateral primary osteoarthritis, right knee: Secondary | ICD-10-CM

## 2023-12-09 NOTE — Telephone Encounter (Signed)
 Please see referrals tab for injection information.

## 2023-12-09 NOTE — Telephone Encounter (Signed)
 Yes, it has been approved and the medication was delivered on Tuesday, 12/08/2023.

## 2023-12-21 ENCOUNTER — Ambulatory Visit (HOSPITAL_BASED_OUTPATIENT_CLINIC_OR_DEPARTMENT_OTHER): Admitting: Orthopaedic Surgery

## 2023-12-21 DIAGNOSIS — M25562 Pain in left knee: Secondary | ICD-10-CM | POA: Diagnosis not present

## 2023-12-21 DIAGNOSIS — M1711 Unilateral primary osteoarthritis, right knee: Secondary | ICD-10-CM | POA: Diagnosis not present

## 2023-12-21 DIAGNOSIS — G8929 Other chronic pain: Secondary | ICD-10-CM | POA: Diagnosis not present

## 2023-12-21 MED ORDER — LIDOCAINE HCL 1 % IJ SOLN
4.0000 mL | INTRAMUSCULAR | Status: AC | PRN
  Administered 2023-12-21: 4 mL

## 2023-12-21 MED ORDER — LIDOCAINE HCL 1 % IJ SOLN
4.0000 mL | INTRAMUSCULAR | Status: AC | PRN
Start: 2023-12-21 — End: 2023-12-21
  Administered 2023-12-21: 4 mL

## 2023-12-21 NOTE — Progress Notes (Signed)
 Chief Complaint: left knee pain     History of Present Illness:   12/21/2023: Presents today bilateral Zilretta  injections.  Gaspar Fowle is a 63 y.o. male presents today for continued right knee pain in the setting of previous meniscal debridement.  He states that he is status post medial lateral meniscal debridement with Dr. Dalldorf in May which was then shortly followed by an additional arthroscopy 7 weeks after.  There was no significant relief following this.  He has recently undergone a Zilretta  injection with Dr. Joane in December of this year which is giving him some relief and improved his swelling.  He was diagnosed with pseudogout for which the knee was previously aspirated as well.  He enjoys being active playing basketball.  He is at this point hoping to avoid a total knee arthroplasty.    PMH/PSH/Family History/Social History/Meds/Allergies:   No past medical history on file. No past surgical history on file. Social History   Socioeconomic History   Marital status: Married    Spouse name: Not on file   Number of children: Not on file   Years of education: Not on file   Highest education level: Not on file  Occupational History   Not on file  Tobacco Use   Smoking status: Every Day    Current packs/day: 0.25    Types: Cigarettes   Smokeless tobacco: Current   Tobacco comments:    smoking 1/3 pack daily 06/04/2020  Substance and Sexual Activity   Alcohol use: Yes   Drug use: Never   Sexual activity: Not on file  Other Topics Concern   Not on file  Social History Narrative   Not on file   Social Drivers of Health   Financial Resource Strain: Not on file  Food Insecurity: Not on file  Transportation Needs: Not on file  Physical Activity: Not on file  Stress: Not on file  Social Connections: Not on file   Family History  Problem Relation Age of Onset   Basal cell carcinoma Brother    Heart attack Mother    No Known Allergies Current Outpatient  Medications  Medication Sig Dispense Refill   atorvastatin (LIPITOR) 20 MG tablet 1 tablet     colchicine 0.6 MG tablet TAKE 1 TABLET BY MOUTH EVERY DAY Oral for 90 Days     hydroxychloroquine (PLAQUENIL) 200 MG tablet Take 400 mg by mouth daily.     sodium hyaluronate, viscosup, (GELSYN-3) 16.8 MG/2ML SOSY intra-articular injection Dr. Joane to inj 2 mL, intra-articular, right knee, once weekly x 3 weeks 6 mL 0   No current facility-administered medications for this visit.   No results found.  Review of Systems:   A ROS was performed including pertinent positives and negatives as documented in the HPI.  Physical Exam :   Constitutional: NAD and appears stated age Neurological: Alert and oriented Psych: Appropriate affect and cooperative There were no vitals taken for this visit.   Comprehensive Musculoskeletal Exam:    Left knee knee with tenderness about the medial joint line.  Positive McMurray medially.  There is no obvious crepitus.  Negative Lachman, negative posterior drawer.  No laxity with varus or valgus.  Right knee with tenderness predominately posterior medial with swelling and fullness.  Range of motion is from 0 to 130 degrees.  There is more swelling on the left compared to the right.  Distal neurosensory exam is intact   Imaging:   Xray (4 views left knee): Significant tibiofemoral  joint space narrowing without evidence of osteoarthritis  MRI left knee: Meniscal root tear variant with medial meniscal extrusion.  Tibial bone bruise with chondral loss involving the medial tibiofemoral joint otherwise preserved lateral and patellofemoral joints  I personally reviewed and interpreted the radiographs.   Assessment and Plan:   63 y.o. male with acute left knee pain with difficulty bearing weight, swelling in the setting of a medial meniscal root tear which is progressed to medial compartment tibiofemoral osteoarthritis.  I did describe that his limb length alignment  views do show a varus deformity with increased weightbearing through the medial joints bilaterally but worse on the left.  I do believe that this was likely the cause of his meniscal root injury which is now progressed to tibiofemoral osteoarthritis.  Overall he has had effectiveness with Zilretta  injections on the right and at this time I do believe he would benefit from an injection of this into both knees.  We will plan to work on insurance authorization for this.  He is also possibly considering partial knee arthroplasty on the left.  - bilateral Zilretta  injections performed after verbal consent obtained     Procedure Note  Patient: Hobert Poplaski             Date of Birth: June 04, 1960           MRN: [id number]             Visit Date: 12/21/2023  Procedures: Visit Diagnoses: No diagnosis found.  Large Joint Inj: R knee on 12/21/2023 12:19 PM Indications: pain Details: 22 G 1.5 in needle, ultrasound-guided anterior approach  Arthrogram: No  Medications: 4 mL lidocaine  1 % Outcome: tolerated well, no immediate complications Procedure, treatment alternatives, risks and benefits explained, specific risks discussed. Consent was given by the patient. Immediately prior to procedure a time out was called to verify the correct patient, procedure, equipment, support staff and site/side marked as required. Patient was prepped and draped in the usual sterile fashion.    Large Joint Inj: L knee on 12/21/2023 12:19 PM Indications: pain Details: 22 G 1.5 in needle, ultrasound-guided anterior approach  Arthrogram: No  Medications: 4 mL lidocaine  1 % Outcome: tolerated well, no immediate complications Procedure, treatment alternatives, risks and benefits explained, specific risks discussed. Consent was given by the patient. Immediately prior to procedure a time out was called to verify the correct patient, procedure, equipment, support staff and site/side marked as required. Patient was prepped and  draped in the usual sterile fashion.          I personally saw and evaluated the patient, and participated in the management and treatment plan.  Elspeth Parker, MD Attending Physician, Orthopedic Surgery  This document was dictated using Dragon voice recognition software. A reasonable attempt at proof reading has been made to minimize errors.

## 2024-01-08 ENCOUNTER — Telehealth: Payer: Self-pay

## 2024-01-08 NOTE — Telephone Encounter (Signed)
   Pre-operative Risk Assessment    Patient Name: Corey Lynch  DOB: [birth year] MRN: [id number]   Date of last office visit: 01/01/22 Date of next office visit: Not scheduled   Request for Surgical Clearance    Procedure:  Left knee unicompartmental arthroplasty  Date of Surgery:  Clearance TBD                                Surgeon:  Elspeth Parker, MD Surgeon's Group or Practice Name:  [facility] number:  [phone] Fax number:  [phone]   Type of Clearance Requested:   - Medical    Type of Anesthesia:  General and Block   Additional requests/questions:    Bonney Ival LOISE Gerome   01/08/2024, 4:07 PM

## 2024-01-08 NOTE — Telephone Encounter (Signed)
   Name: Junah Yam  DOB: November 15, 1960  MRN: [id number]  Primary Cardiologist: Alvan Ronal BRAVO, MD (Inactive)  Chart reviewed as part of pre-operative protocol coverage. Because of Hurman Sackmann's past medical history and time since last visit, he will require a follow-up in-office visit in order to better assess preoperative cardiovascular risk. Last seen by Dr.Branch on 01/01/2022.   Pre-op covering staff: - Please schedule appointment and call patient to inform them. If patient already had an upcoming appointment within acceptable timeframe, please add pre-op clearance to the appointment notes so provider is aware. - Please contact requesting surgeon's office via preferred method (i.e, phone, fax) to inform them of need for appointment prior to surgery.    Lamarr Satterfield, NP  01/08/2024, 4:12 PM

## 2024-01-08 NOTE — Telephone Encounter (Signed)
 OV preop clearance appt has now been scheduled

## 2024-02-17 NOTE — Progress Notes (Signed)
 Cardiology Office Note    Patient Name: Corey Lynch Date of Encounter: 02/17/2024  Primary Care Provider:  Regino Slater, MD Primary Cardiologist:  Alvan Ronal BRAVO, MD (Inactive) Primary Electrophysiologist: None   Past Medical History    No past medical history on file.  History of Present Illness  Corey Lynch is a 63 y.o. male with a PMH of nonobstructive CAD, aortic atherosclerosis, pulmonary nodules, HLD, tobacco abuse who presents today for preoperative clearance.  Mr. Corey Lynch was seen initially by Dr. Alvan in 2023 elevated coronary calcium score.  He was noted to have a CAC of 676 was started on atorvastatin 20 mg by his PCP.  He completed a Myoview  to rule out ischemia that was normal and low risk with normal ejection fraction.  He had a prior 2D echo completed in 2016 with normal LV function and mild LVH.  He completed CT of the lungs for cancer screening that showed pulmonary nodules and three-vessel coronary disease with aortic atherosclerosis.   Mr. Corey Lynch presents today for preoperative clearance visit.  He reports that since his previous follow-up that he has been doing well with no new complaints.  He was referred by Dr. Rozena for preoperative cardiac evaluation due to the use of general anesthesia in the upcoming knee replacement surgery.He has been on statin therapy since the discovery of the elevated calcium score. A stress test approximately 18 months ago showed normal results with no issues in heart function or perfusion. He has a family history of heart disease, with his father having passed away from a heart attack two years ago. He is concerned about his cardiac health due to this family history and his own elevated calcium score. He quit smoking two years ago and has not had a cigarette since. He previously smoked but has made significant lifestyle changes, including cessation of smoking and adherence to prescribed medications. He has undergone two knee arthroscopies  and a colonoscopy in the past 15 months, all of which used MAC anesthesia. He is scheduled for a partial knee replacement, which will require general anesthesia, prompting the need for cardiac evaluation. No chest pain, shortness of breath, or dizziness. Reports occasional palpitations but no significant arrhythmias on recent evaluations.  Discussed the use of AI scribe software for clinical note transcription with the patient, who gave verbal consent to proceed.  History of Present Illness   Review of Systems  Please see the history of present illness.    All other systems reviewed and are otherwise negative except as noted above.  Physical Exam    Wt Readings from Last 3 Encounters:  04/20/23 210 lb (95.3 kg)  08/14/22 207 lb (93.9 kg)  08/05/22 203 lb (92.1 kg)   CD:Uyzmz were no vitals filed for this visit.,There is no height or weight on file to calculate BMI. GEN: Well nourished, well developed in no acute distress Neck: No JVD; No carotid bruits Pulmonary: Clear to auscultation without rales, wheezing or rhonchi  Cardiovascular: Normal rate. Regular rhythm. Normal S1. Normal S2.   Murmurs: There is no murmur.  ABDOMEN: Soft, non-tender, non-distended EXTREMITIES:  No edema; No deformity   EKG/LABS/ Recent Cardiac Studies   ECG personally reviewed by me today -sinus bradycardia with TWI in lead III and rate of 58 bpm with no acute changes consistent with previous EKG.  Risk Assessment/Calculations:        No results found for: WBC, HGB, HCT, MCV, PLT No results found for: CREATININE, BUN, NA, K, CL, CO2  No results found for: CHOL, HDL, LDLCALC, LDLDIRECT, TRIG, CHOLHDL  No results found for: HGBA1C Assessment & Plan    Assessment & Plan   1.  Nonobstructive CAD: - Coronary calcium score completed showing elevated score of 676 and further evaluation to rule out ischemia with exercise Myoview  on 10/17/2021 that was low risk and  normal. - Reports no cardiac complaints since previous follow-up. - Continue statin therapy. - Discuss with primary care physician about ongoing cardiology follow-up.  2.  Hyperlipidemia: - Patient's last LDL cholesterol was at goal of 48 - Continue Lipitor 20 mg  3.  Preoperative clearance: - Patient's RCRI score is 0.9% The patient affirms he has been doing well without any new cardiac symptoms. They are able to achieve 7 METS without cardiac limitations. Therefore, based on ACC/AHA guidelines, the patient would be at acceptable risk for the planned procedure without further cardiovascular testing. The patient was advised that if he develops new symptoms prior to surgery to contact our office to arrange for a follow-up visit, and he verbalized understanding.   4.  Pulmonary nodules: - Seen on annual CT with recommendation to repeat annually for surveillance.  Disposition: Follow-up with new cardiologist as needed    Signed, Wyn Raddle, Jackee Shove, NP 02/17/2024, 7:04 PM [facility]

## 2024-02-18 ENCOUNTER — Encounter: Payer: Self-pay | Admitting: Nurse Practitioner

## 2024-02-18 ENCOUNTER — Ambulatory Visit: Attending: Nurse Practitioner | Admitting: Nurse Practitioner

## 2024-02-18 VITALS — BP 110/70 | HR 70 | Ht 72.0 in | Wt 208.0 lb

## 2024-02-18 DIAGNOSIS — E785 Hyperlipidemia, unspecified: Secondary | ICD-10-CM | POA: Diagnosis not present

## 2024-02-18 DIAGNOSIS — Z01810 Encounter for preprocedural cardiovascular examination: Secondary | ICD-10-CM

## 2024-02-18 DIAGNOSIS — R918 Other nonspecific abnormal finding of lung field: Secondary | ICD-10-CM | POA: Diagnosis not present

## 2024-02-18 DIAGNOSIS — I2510 Atherosclerotic heart disease of native coronary artery without angina pectoris: Secondary | ICD-10-CM | POA: Diagnosis not present

## 2024-02-18 NOTE — Patient Instructions (Incomplete)
 Medication Instructions:  Your physician recommends that you continue on your current medications as directed. Please refer to the Current Medication list given to you today. *If you need a refill on your cardiac medications before your next appointment, please call your pharmacy*  Lab Work: None ordered If you have labs (blood work) drawn today and your tests are completely normal, you will receive your results only by: MyChart Message (if you have MyChart) OR A paper copy in the mail If you have any lab test that is abnormal or we need to change your treatment, we will call you to review the results.  Testing/Procedures: None ordered  Follow-Up: At [facility], you and your health needs are our priority.  As part of our continuing mission to provide you with exceptional heart care, our providers are all part of one team.  This team includes your primary Cardiologist (physician) and Advanced Practice Providers or APPs (Physician Assistants and Nurse Practitioners) who all work together to provide you with the care you need, when you need it.  Your next appointment:   {numbers 1-12:[id number]} {Time; day/wk/mo/yr(s):9076}  Provider:   Alvan Ronal BRAVO, MD (Inactive) {If no MD populates, click here to update Cardiologist or EP   DO NOT delete brackets or number around this link :1}   We recommend signing up for the patient portal called MyChart.  Sign up information is provided on this After Visit Summary.  MyChart is used to connect with patients for Virtual Visits (Telemedicine).  Patients are able to view lab/test results, encounter notes, upcoming appointments, etc.  Non-urgent messages can be sent to your provider as well.   To learn more about what you can do with MyChart, go to [email or web address].   Other Instructions 9

## 2024-03-16 ENCOUNTER — Ambulatory Visit (HOSPITAL_BASED_OUTPATIENT_CLINIC_OR_DEPARTMENT_OTHER): Admitting: Orthopaedic Surgery

## 2024-03-16 ENCOUNTER — Ambulatory Visit (HOSPITAL_BASED_OUTPATIENT_CLINIC_OR_DEPARTMENT_OTHER): Payer: Self-pay | Admitting: Orthopaedic Surgery

## 2024-03-16 ENCOUNTER — Other Ambulatory Visit (HOSPITAL_BASED_OUTPATIENT_CLINIC_OR_DEPARTMENT_OTHER): Payer: Self-pay

## 2024-03-16 DIAGNOSIS — M1711 Unilateral primary osteoarthritis, right knee: Secondary | ICD-10-CM

## 2024-03-16 DIAGNOSIS — S83207A Unspecified tear of unspecified meniscus, current injury, left knee, initial encounter: Secondary | ICD-10-CM

## 2024-03-16 MED ORDER — ACETAMINOPHEN 500 MG PO TABS
500.0000 mg | ORAL_TABLET | Freq: Three times a day (TID) | ORAL | 0 refills | Status: AC
  Filled 2024-03-16: qty 30, 10d supply, fill #0

## 2024-03-16 MED ORDER — ASPIRIN 325 MG PO TBEC
325.0000 mg | DELAYED_RELEASE_TABLET | Freq: Every day | ORAL | 0 refills | Status: AC
  Filled 2024-03-16: qty 14, 14d supply, fill #0

## 2024-03-16 MED ORDER — OXYCODONE HCL 5 MG PO TABS
5.0000 mg | ORAL_TABLET | ORAL | 0 refills | Status: DC | PRN
  Filled 2024-03-16: qty 5, 1d supply, fill #0

## 2024-03-16 MED ORDER — IBUPROFEN 800 MG PO TABS
800.0000 mg | ORAL_TABLET | Freq: Three times a day (TID) | ORAL | 0 refills | Status: AC
  Filled 2024-03-16: qty 30, 10d supply, fill #0

## 2024-03-16 NOTE — Progress Notes (Signed)
 Chief Complaint: left knee pain     History of Present Illness:   03/16/2024: Presents for continued discussion of the left knee.  He has been getting very good relief from the Zilretta  injection on the right.  Corey Lynch is a 63 y.o. male presents today for continued right knee pain in the setting of previous meniscal debridement.  He states that he is status post medial lateral meniscal debridement with Dr. Dalldorf in May which was then shortly followed by an additional arthroscopy 7 weeks after.  There was no significant relief following this.  He has recently undergone a Zilretta  injection with Dr. Joane in December of this year which is giving him some relief and improved his swelling.  He was diagnosed with pseudogout for which the knee was previously aspirated as well.  He enjoys being active playing basketball.  He is at this point hoping to avoid a total knee arthroplasty.    PMH/PSH/Family History/Social History/Meds/Allergies:   No past medical history on file. No past surgical history on file. Social History   Socioeconomic History   Marital status: Married    Spouse name: Not on file   Number of children: Not on file   Years of education: Not on file   Highest education level: Not on file  Occupational History   Not on file  Tobacco Use   Smoking status: Every Day    Current packs/day: 0.25    Types: Cigarettes   Smokeless tobacco: Current   Tobacco comments:    smoking 1/3 pack daily 06/04/2020  Substance and Sexual Activity   Alcohol use: Yes   Drug use: Never   Sexual activity: Not on file  Other Topics Concern   Not on file  Social History Narrative   Not on file   Social Drivers of Health   Financial Resource Strain: Not on file  Food Insecurity: Not on file  Transportation Needs: Not on file  Physical Activity: Not on file  Stress: Not on file  Social Connections: Not on file   Family History  Problem Relation Age of Onset   Basal cell  carcinoma Brother    Heart attack Mother    No Known Allergies Current Outpatient Medications  Medication Sig Dispense Refill   acetaminophen (TYLENOL) 500 MG tablet Take 1 tablet (500 mg total) by mouth every 8 (eight) hours for 10 days. 30 tablet 0   aspirin  EC 325 MG tablet Take 1 tablet (325 mg total) by mouth daily. 14 tablet 0   ibuprofen (ADVIL) 800 MG tablet Take 1 tablet (800 mg total) by mouth every 8 (eight) hours for 10 days. Please take with food, please alternate with acetaminophen 30 tablet 0   oxyCODONE (ROXICODONE) 5 MG immediate release tablet Take 1 tablet (5 mg total) by mouth every 4 (four) hours as needed for severe pain (pain score 7-10) or breakthrough pain. 5 tablet 0   atorvastatin (LIPITOR) 20 MG tablet 1 tablet     sodium hyaluronate, viscosup, (GELSYN-3) 16.8 MG/2ML SOSY intra-articular injection Dr. Joane to inj 2 mL, intra-articular, right knee, once weekly x 3 weeks 6 mL 0   No current facility-administered medications for this visit.   No results found.  Review of Systems:   A ROS was performed including pertinent positives and negatives as documented in the HPI.  Physical Exam :   Constitutional: NAD and appears stated age Neurological: Alert and oriented Psych: Appropriate affect and cooperative There were no vitals taken  for this visit.   Comprehensive Musculoskeletal Exam:    Left knee knee with tenderness about the medial joint line.  Positive McMurray medially.  There is no obvious crepitus.  Negative Lachman, negative posterior drawer.  No laxity with varus or valgus.  Right knee with tenderness predominately posterior medial with swelling and fullness.  Range of motion is from 0 to 130 degrees.  There is more swelling on the left compared to the right.  Distal neurosensory exam is intact   Imaging:   Xray (4 views left knee): Significant tibiofemoral joint space narrowing without evidence of osteoarthritis  MRI left knee: Meniscal root  tear variant with medial meniscal extrusion.  Tibial bone bruise with chondral loss involving the medial tibiofemoral joint otherwise preserved lateral and patellofemoral joints  I personally reviewed and interpreted the radiographs.   Assessment and Plan:   63 y.o. male with acute left knee pain with difficulty bearing weight, swelling in the setting of a medial meniscal root tear which is progressed to mild tibiofemoral osteoarthritis.  I did discuss treatment options with him today.  I did discuss that ultimately I do believe he may benefit from knee arthroscopy on the left side for meniscal root repair and centralization.  I did discuss the expected associated recovery and the risks and limitations associated with this.  After discussion he would like to proceed   - Plan for left knee arthroscopy with medial meniscal root repair and centralization   After a lengthy discussion of treatment options, including risks, benefits, alternatives, complications of surgical and nonsurgical conservative options, the patient elected surgical repair.   The patient  is aware of the material risks  and complications including, but not limited to injury to adjacent structures, neurovascular injury, infection, numbness, bleeding, implant failure, thermal burns, stiffness, persistent pain, failure to heal, disease transmission from allograft, need for further surgery, dislocation, anesthetic risks, blood clots, risks of death,and others. The probabilities of surgical success and failure discussed with patient given their particular co-morbidities.The time and nature of expected rehabilitation and recovery was discussed.The patient's questions were all answered preoperatively.  No barriers to understanding were noted. I explained the natural history of the disease process and Rx rationale.  I explained to the patient what I considered to be reasonable expectations given their personal situation.  The final treatment  plan was arrived at through a shared patient decision making process model.     I personally saw and evaluated the patient, and participated in the management and treatment plan.  Elspeth Parker, MD Attending Physician, Orthopedic Surgery  This document was dictated using Dragon voice recognition software. A reasonable attempt at proof reading has been made to minimize errors.

## 2024-03-21 ENCOUNTER — Telehealth: Payer: Self-pay | Admitting: Orthopaedic Surgery

## 2024-03-21 NOTE — Telephone Encounter (Signed)
 I spoke with the patient and offered him 04/07/24 for surgery. Patient requesting a call from doctor in reference to surgery date. Patient wants to know if he will be able to ski with surgery being this late or if he needs to wait until after skiing to have surgery. Please call patient to advise.

## 2024-03-24 NOTE — Telephone Encounter (Signed)
 I called patient again to discuss surgery dates. He asked to call me back when he had his calendar available.

## 2024-03-28 ENCOUNTER — Encounter: Payer: Self-pay | Admitting: Radiology

## 2024-06-16 ENCOUNTER — Other Ambulatory Visit: Payer: Self-pay | Admitting: Orthopedic Surgery

## 2024-06-16 ENCOUNTER — Encounter (HOSPITAL_BASED_OUTPATIENT_CLINIC_OR_DEPARTMENT_OTHER): Payer: Self-pay | Admitting: Orthopaedic Surgery

## 2024-06-16 ENCOUNTER — Other Ambulatory Visit (HOSPITAL_BASED_OUTPATIENT_CLINIC_OR_DEPARTMENT_OTHER): Payer: Self-pay

## 2024-06-16 ENCOUNTER — Ambulatory Visit (HOSPITAL_BASED_OUTPATIENT_CLINIC_OR_DEPARTMENT_OTHER): Payer: Self-pay | Admitting: Orthopaedic Surgery

## 2024-06-16 DIAGNOSIS — M1712 Unilateral primary osteoarthritis, left knee: Secondary | ICD-10-CM | POA: Diagnosis not present

## 2024-06-16 MED ORDER — OXYCODONE HCL 5 MG PO TABS: 5.0000 mg | ORAL_TABLET | ORAL | 0 refills | Status: AC | PRN

## 2024-06-16 NOTE — Progress Notes (Signed)
 "  Date of Surgery: 06/16/2024  INDICATIONS: Corey Lynch is a 64 y.o.-year-old male with left knee medial unicompartmental osteoarthritis.  The risk and benefits of the procedure were discussed in detail and documented in the pre-operative evaluation.   PREOPERATIVE DIAGNOSIS: 1. Left knee osteoarthritis  POSTOPERATIVE DIAGNOSIS: Same.  PROCEDURE: 1. Left knee unicompartmental osteoarthritis  SURGEON: Elspeth LITTIE Parker MD  ASSISTANT: Conley Dawson, ATC  ANESTHESIA:  general  IV FLUIDS AND URINE: See anesthesia record.  ANTIBIOTICS: Ancef  ESTIMATED BLOOD LOSS: 100 mL.  IMPLANTS:  * No surgical log found *  DRAINS: None  CULTURES: None  COMPLICATIONS: none  DESCRIPTION OF PROCEDURE:  The patient was identified in the pre-operative holding area, marked, and consent completed.  Anesthesia was then performed with regional nerve block.  The patient was transferred to the operative suite and placed in the supine position with all bony prominences padded. SCDs were placed on the non-operative lower extremity. Appropriate antibiotics was administered within 1 hour before incision. The patient underwent general anesthesia with the anesthesia team. The right lower extremity was then prepped and draped in standard fashion.A time out was performed confirming the correct extremity, correct patient and correct procedures.   Anterior midline incision was marked and made just medial to the midline from just above the patella down to the tibial tubercle. Full-thickness flaps were raised and hemostasis maintained using electrocautery. We then performed a medial parapatellar arthrotomy taking care to preserve the parts of the joint that would not be altered, including ligaments and patellofemoral joint. We excised the fat pad and performed a medial peel on the tibia, noting grade IV changes on the femoral condyle and tibia. Otherwise the patella and remaining articular cartilage demonstrated minimal  degenerative wear and both the ACL and PCL were functioning and intact. The medial meniscus was resected at this point.  We then turned our attention to the tibia, placing the extramedullary reference guide in the appropriate position and adjusted it so that the appropriate amount of bone was resected at an angle recreating native slope. We then performed the cuts with a sagittal saw vertically at the medial spine, preserving ACL and PCL, followed by the transverse cut with the oscillating saw. Once we were happy with the cut we checked the gap sizing with a size 9mm block in flexion and extension and noted it to be appropriate.  We then turned our attention to the femur, setting up the distal femoral resection guide on the spacing block in extension, securing it with pins. The distal femoral cut was cut performed while protecting the notch and skin with retractors. Lastly the knee was flexed and the posterior resection guide was secured with pins and the cut was performed. We then used osteotomes and rongeurs to complete and finish all cuts. We then re-checked our gap balancing and noted it to be appropriate once more. We then sized the distal femur and then secured this with pins and cut the posterior chamfer and drilling the lugs.We next sized the tibia using sizing rings and then trialed all components with the 9mm insert. The knee was brought though its range of motion and found to be satisfactory in flexion/extension. We then removed the trials and inserted the guide to finalize the tibia with drill and keel, this was performed without complication.  We then irrigated all bony surfaces with pulse suction irrigator and dried them, and inspected cuts and soft tissues, noting that all were appropriate and ready for implantation. We then  proceeded with mixing cement and placing a 4x8 sponge at the posterior aspect of the knee, held in place by a Z-retractor to prevent excess cement from passing in that  direction.Once the cement was appropriately tacky we placed the appropriate amount on the tibial implant and placed the implant in the tibia, keeping it compressed and noting that it was sitting in the same position as previously trialed. Excess cement was removed and we irrigated and dried the femoral bone and mixed a new batch of cement. Once it was appropriately tacky we applied it to the femoral component and inserted the component, removed excess cement, and then inserted the polyethylene insert and extended the knee fully, compressing the knee while the cement hardened. We then inspected the knee and irrigated it, removing all excess cement and ranging it fully. We were happy with the motion of the knee, flexing to >130 and stable throughout. We then closed the arthrotomy with 0-vicryl, followed by 2-0 vicryl buried stitches at the skin and then skin staples. We cleaned and dried the wound, applied sterile dressings and let down the tourniquet.All counts were correct at the end of the case.The patient awoke from anesthesia without difficulty and was transferred to the PACU in stable condition.     POSTOPERATIVE PLAN: He will be weight bearing as tolerated. He will be placed on aspirin  for blood clot prevention. He will begin PT immediately postop.  Elspeth LITTIE Parker, MD 4:01 PM    "

## 2024-06-17 ENCOUNTER — Telehealth: Payer: Self-pay

## 2024-06-17 ENCOUNTER — Encounter (HOSPITAL_BASED_OUTPATIENT_CLINIC_OR_DEPARTMENT_OTHER): Payer: Self-pay | Admitting: Orthopaedic Surgery

## 2024-06-17 NOTE — Telephone Encounter (Signed)
 Pt called to say that he is having bleeding on his Ace wrap but not on his white wrap. He was wondering what to do. I advised the pt to send a picture through my chart and he could come into the office to do a bandage change if he would like.

## 2024-06-20 ENCOUNTER — Encounter: Payer: Self-pay | Admitting: Rehabilitative and Restorative Service Providers"

## 2024-06-21 ENCOUNTER — Encounter: Payer: Self-pay | Admitting: Rehabilitative and Restorative Service Providers"

## 2024-06-21 ENCOUNTER — Ambulatory Visit: Attending: Orthopaedic Surgery | Admitting: Rehabilitative and Restorative Service Providers"

## 2024-06-21 ENCOUNTER — Other Ambulatory Visit: Payer: Self-pay

## 2024-06-21 DIAGNOSIS — R600 Localized edema: Secondary | ICD-10-CM

## 2024-06-21 DIAGNOSIS — S83207A Unspecified tear of unspecified meniscus, current injury, left knee, initial encounter: Secondary | ICD-10-CM | POA: Insufficient documentation

## 2024-06-21 DIAGNOSIS — M6281 Muscle weakness (generalized): Secondary | ICD-10-CM

## 2024-06-21 DIAGNOSIS — R262 Difficulty in walking, not elsewhere classified: Secondary | ICD-10-CM

## 2024-06-21 DIAGNOSIS — G8929 Other chronic pain: Secondary | ICD-10-CM

## 2024-06-21 NOTE — Therapy (Signed)
 " OUTPATIENT PHYSICAL THERAPY LOWER EXTREMITY EVALUATION   Patient Name: Corey Lynch MRN: [id number] DOB:November 18, 1960, 64 y.o., male Today's Date: 06/21/2024  END OF SESSION:  PT End of Session - 06/21/24 1517     Visit Number 1    Number of Visits 16    Date for Recertification  08/20/24    Authorization Type [employer] PPO    PT Start Time 1400    PT Stop Time 1445    PT Time Calculation (min) 45 min    Activity Tolerance Patient tolerated treatment well    Behavior During Therapy [facility] for tasks assessed/performed          History reviewed. No pertinent past medical history. History reviewed. No pertinent surgical history. Patient Active Problem List   Diagnosis Date Noted   Primary osteoarthritis of right knee 04/20/2023   Chronic pain of right knee 04/20/2023   Pseudogout of right knee 04/20/2023   Acute lateral meniscal tear, right, initial encounter 07/01/2022   Degenerative disc disease, cervical 10/11/2019   Nonallopathic lesion of cervical region 10/11/2019   Nonallopathic lesion of thoracic region 10/11/2019    PCP: Dibas  Regino, MD REFERRING PROVIDER: Elspeth Parker, MD  REFERRING DIAG:  Diagnosis  S83.207A (ICD-10-CM) - Acute meniscal tear of left knee, initial encounter    THERAPY DIAG:  Difficulty in walking, not elsewhere classified  Muscle weakness (generalized)  Localized edema  Chronic pain of right knee  Rationale for Evaluation and Treatment: Rehabilitation  ONSET DATE: 06/16/24  SUBJECTIVE:  SUBJECTIVE STATEMENT: The patient is s/p L knee unilateral medial comparment arthroplasty 06/16/24. He has h/o L knee meniscus tear and notes limping due to bilateral knee pain for the last 18 months. He is not using an assistive device since surgery and is icing/elevating at home. He has not used narcotic meds since Sunday morning. He inquired about driving (shared Dr. Danetta post surgical information re: driving).   PERTINENT HISTORY: OA of R  knee, meniscus tear R side, h/o L meniscus tear  PAIN:  Are you having pain? Yes: NPRS scale: 1/10, I can feel it, 2/10 with walking. Pain location: L knee  Pain description: discomfort Aggravating factors: walking Relieving factors: ice, elevation  PRECAUTIONS: None  RED FLAGS: None   WEIGHT BEARING RESTRICTIONS: Yes WBAT  FALLS:  Has patient fallen in last 6 months? 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   NEXT MD VISIT: 06/30/24  OBJECTIVE:  Note: Objective measures were completed at Evaluation unless otherwise noted.  DIAGNOSTIC FINDINGS: MRI left knee//prior to surgery Meniscal root tear variant with medial meniscal extrusion.  Tibial bone bruise with chondral loss involving the medial tibiofemoral joint otherwise preserved lateral and patellofemoral joints  PATIENT SURVEYS:  LEFS =60%   COGNITION: Overall cognitive status: Within functional limits for tasks assessed     SENSATION: WFL  EDEMA:  Mild edema noted  PT changed initial bandage-- removed bandaging with heavy bloody exudate and placed clean 4x4 with tegaderm over L knee. Provided dressing for home change with instruction to keep wound clean/dry. Extra bandage if there is any drainage or moisture and he needs  to replace before his next PT session.   POSTURE: No Significant postural limitations  PALPATION: Mild edema   LOWER EXTREMITY ROM: Active ROM Right eval Left eval  Hip flexion    Hip extension    Hip abduction    Hip adduction    Hip internal rotation    Hip external rotation    Knee flexion  78  Knee  extension  0  Ankle dorsiflexion    Ankle plantarflexion    Ankle inversion    Ankle eversion     (Blank rows = not tested)  LOWER EXTREMITY MMT: TO BE ASSESSED MMT Right eval Left eval  Hip flexion    Hip extension    Hip abduction    Hip adduction    Hip internal rotation    Hip external rotation    Knee flexion    Knee extension    Ankle dorsiflexion    Ankle plantarflexion    Ankle inversion    Ankle eversion     (Blank rows = not tested)  FUNCTIONAL TESTS:  Gait speed=1.82 ft/sec  GAIT: Distance walked: 75 feet Assistive device utilized: None Level of assistance: Modified independence Comments: Gait characterized by clicking sensation medial L knee, he has decreased knee flexion with initial swing leading to a vaulting gait pattern  Stairs with step to pattern mod indep                                                                                                                              [facility] Adult PT Treatment:                                                DATE: 06/21/24 Therapeutic Exercise: Standing Heel raises Supine  SLR --initiated--- did not give for HEP due to more elevated pain Heel slides -- x 5 reps Quad sets x 5 reps Self Care: Education re: dressing changes Education re: driving  PATIENT EDUCATION:  Education details: Plan of care, wound care, driving Person educated: Patient Education method: Explanation Education comprehension: verbalized understanding  HOME EXERCISE PROGRAM: Access Code: [email or web address] URL: [email or web address] Date: 06/21/2024 Prepared by: Tawni Ferrier  Exercises - Supine Quad Set  - 2 x daily - 7 x weekly - 1 sets - 10 reps - Supine Heel Slide  - 2 x daily - 7 x weekly - 1 sets - 10 reps - Heel Raises with Counter Support  - 2 x daily - 7 x weekly - 1 sets - 10 reps  ASSESSMENT:  CLINICAL IMPRESSION: Patient is a 64 y.o. male who was seen today for physical therapy evaluation and treatment  for L knee unilateral compartment arthroplasty. He presents today with dec'd AROM, mild edema, mild pain, gait deviations, dec'd balance, and dec'd functional mobility. PT to address deficits to promote  improved mobility and return to prior recreational activities.   OBJECTIVE IMPAIRMENTS: Abnormal gait, decreased activity tolerance, decreased ROM, decreased strength, hypomobility, increased fascial restrictions, impaired flexibility, and pain.   ACTIVITY LIMITATIONS: lifting, bending, squatting, and locomotion level  PARTICIPATION LIMITATIONS: community activity  PERSONAL FACTORS: 1-2 comorbidities: prior h/o L meniscus tear, prior h/o R knee surgeries are also affecting patient's functional outcome.   REHAB POTENTIAL: Good  CLINICAL DECISION MAKING: Evolving/moderate complexity  EVALUATION COMPLEXITY: Moderate   GOALS: Goals reviewed with patient? Yes  SHORT TERM GOALS: Target date: 07/22/24  The patient will be indep with initial HEP Baseline: initiated at eval Goal status: INITIAL  2.  The patient will improve knee AROM to 95 degrees flexion.  Baseline:  78 deg Goal status: INITIAL  3.  The patient will perform SLR x 10 reps without increased pain. Baseline:  Medial knee pain Goal status: INITIAL   LONG TERM GOALS: Target date: 08/20/24  The patient will be indep with progression of HEP. Baseline:  initiated at eval Goal status: INITIAL  2.  The patient will improve LEFS by 18%  to demonstrate improved functional abilities. Baseline: 60% Goal status: INITIAL  3.  The patient will demonstrate stair negotiation with reciprocal pattern x 8 steps mod indep. Baseline:  step to pattern. Goal status: INITIAL  4.  The patient will improve L knee AROM to 115 degrees. Baseline:  78 deg Goal status: INITIAL  5.  The patient will improve gait speed to > or equal to 3.0 ft/sec. Baseline: 1.82 ft/sec Goal status: INITIAL  6.  The patient will return to gym routine, begin  return to recreational activities. Baseline:  Not able Goal status: INITIAL   PLAN:  PT FREQUENCY: 2x/week  PT DURATION: 8 weeks  PLANNED INTERVENTIONS: 97164- PT Re-evaluation, 97750- Physical Performance Testing, 97110-Therapeutic exercises, 97530- Therapeutic activity, [id number]- Neuromuscular re-education, 97535- Self Care, 02859- Manual therapy, [id number]- Gait training, [phone]- Electrical stimulation (unattended), [phone]- Electrical stimulation (manual), 20560 (1-2 muscles), 20561 (3+ muscles)- Dry Needling, Patient/Family education, Balance training, Stair training, Taping, Joint mobilization, and Cryotherapy  PLAN FOR NEXT SESSION: progress HEP, gait training, stair negotiation, ROM, check dressing and change if needed.   Foday Cone, PT 06/21/2024, 3:18 PM  "

## 2024-06-23 ENCOUNTER — Encounter: Payer: Self-pay | Admitting: Rehabilitative and Restorative Service Providers"

## 2024-06-23 ENCOUNTER — Ambulatory Visit: Admitting: Rehabilitative and Restorative Service Providers"

## 2024-06-23 DIAGNOSIS — G8929 Other chronic pain: Secondary | ICD-10-CM

## 2024-06-23 DIAGNOSIS — R600 Localized edema: Secondary | ICD-10-CM

## 2024-06-23 DIAGNOSIS — M6281 Muscle weakness (generalized): Secondary | ICD-10-CM

## 2024-06-23 DIAGNOSIS — S83207A Unspecified tear of unspecified meniscus, current injury, left knee, initial encounter: Secondary | ICD-10-CM | POA: Diagnosis not present

## 2024-06-23 DIAGNOSIS — R262 Difficulty in walking, not elsewhere classified: Secondary | ICD-10-CM

## 2024-06-23 NOTE — Therapy (Signed)
 " OUTPATIENT PHYSICAL THERAPY LOWER EXTREMITY TREATMENT   Patient Name: Sandon Yoho MRN: [id number] DOB:[birth year], 64 y.o., male Today's Date: 06/23/2024  END OF SESSION:  PT End of Session - 06/23/24 1149     Visit Number 2    Number of Visits 16    Date for Recertification  08/20/24    Authorization Type [employer] PPO    PT Start Time 1150    PT Stop Time 1235    PT Time Calculation (min) 45 min    Activity Tolerance Patient tolerated treatment well    Behavior During Therapy [facility] for tasks assessed/performed          History reviewed. No pertinent past medical history. History reviewed. No pertinent surgical history. Patient Active Problem List   Diagnosis Date Noted   Primary osteoarthritis of right knee 04/20/2023   Chronic pain of right knee 04/20/2023   Pseudogout of right knee 04/20/2023   Acute lateral meniscal tear, right, initial encounter 07/01/2022   Degenerative disc disease, cervical 10/11/2019   Nonallopathic lesion of cervical region 10/11/2019   Nonallopathic lesion of thoracic region 10/11/2019    PCP: Dibas  Regino, MD REFERRING PROVIDER: Elspeth Parker, MD  REFERRING DIAG:  Diagnosis  [phone] (ICD-10-CM) - Acute meniscal tear of left knee, initial encounter    THERAPY DIAG:  Difficulty in walking, not elsewhere classified  Muscle weakness (generalized)  Localized edema  Chronic pain of right knee  Rationale for Evaluation and Treatment: Rehabilitation  ONSET DATE: 06/16/24  SUBJECTIVE:  SUBJECTIVE STATEMENT: The patient notes pain in medial aspect of the leg with knee flexion. He arrives today walking without a device with antalgic gait pattern.  He is feeling medial knee, distal medial LE and posterior knee as   EVAL: The patient is s/p L knee unilateral medial comparment arthroplasty 06/16/24. He has h/o L knee meniscus tear and notes limping due to bilateral knee pain for the last 18 months. He is not using an assistive device since  surgery and is icing/elevating at home. He has not used narcotic meds since Sunday morning. He inquired about driving (shared Dr. Danetta post surgical information re: driving).   PERTINENT HISTORY: OA of R knee, meniscus tear R side, h/o L meniscus tear  PAIN:  Are you having pain? Yes: NPRS scale: 1/10, I can feel it, 2/10 at times. Pain location: L knee  Pain description: discomfort Aggravating factors: walking Relieving factors: ice, elevation  PRECAUTIONS: None  WEIGHT BEARING RESTRICTIONS: Yes WBAT  FALLS:  Has patient fallen in last 6 months? 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   NEXT MD VISIT: 06/30/24  OBJECTIVE:  Note: Objective measures were completed at Evaluation unless otherwise noted.  DIAGNOSTIC FINDINGS: MRI left knee//prior to surgery Meniscal root tear variant with medial meniscal extrusion.  Tibial bone bruise with chondral loss involving the medial tibiofemoral joint otherwise preserved lateral and patellofemoral joints  PATIENT SURVEYS:  LEFS =60%   COGNITION: Overall cognitive status: Within functional limits for tasks assessed     SENSATION: WFL  EDEMA:  Mild edema noted  PT changed initial bandage-- removed bandaging with  heavy bloody exudate and placed clean 4x4 with tegaderm over L knee. Provided dressing for home change with instruction to keep wound clean/dry. Extra bandage if there is any drainage or moisture and he needs to replace before his next PT session.   POSTURE: No Significant postural limitations  PALPATION: Mild edema   LOWER EXTREMITY  ROM: Active ROM Right eval Left eval  Hip flexion    Hip extension    Hip abduction    Hip adduction    Hip internal rotation    Hip external rotation    Knee flexion  78  Knee extension  0  Ankle dorsiflexion    Ankle plantarflexion    Ankle inversion    Ankle eversion     (Blank rows = not tested)  LOWER EXTREMITY MMT: TO BE ASSESSED MMT Right eval Left eval  Hip flexion    Hip extension    Hip abduction    Hip adduction    Hip internal rotation    Hip external rotation    Knee flexion    Knee extension    Ankle dorsiflexion    Ankle plantarflexion    Ankle inversion    Ankle eversion     (Blank rows = not tested)  FUNCTIONAL TESTS:  Gait speed=1.82 ft/sec  GAIT: Distance walked: 75 feet Assistive device utilized: None Level of assistance: Modified independence Comments: Gait characterized by clicking sensation medial L knee, he has decreased knee flexion with initial swing leading to a vaulting gait pattern  Stairs with step to pattern mod indep                  [facility] Adult PT Treatment:                                                DATE: 06/23/24 Therapeutic Exercise: Supine Heel slides x 10 reps Knee felxion on physioball x 10 reps Quad set x 5 reps SLR x 10reps HS stretch with strap x 2 reps Sidelying Hip abduction x 10 reps Seated Long arc quad x 10 reps Standing Terminal knee extension x 10 reps Heel cord stretch Gait: Ambulation with heel strike working on normalizing gait pattern Backward walking x 20 feet x 4 reps Modalities: Vasopneumatic x 10 minutes L knee x 34 degrees                                                                                                               [facility] Adult PT Treatment:                                                DATE: 06/21/24 Therapeutic Exercise: Standing Heel raises Supine  SLR --initiated--- did not give for HEP due to more elevated  pain Heel slides -- x 5 reps Quad sets x 5 reps Self  Care: Education re: dressing changes Education re: driving  PATIENT EDUCATION:  Education details: Plan of care, wound care, driving Person educated: Patient Education method: Explanation Education comprehension: verbalized understanding  HOME EXERCISE PROGRAM: Access Code: [email or web address] URL: [email or web address] Date: 06/23/2024 Prepared by: Tawni Ferrier  Exercises - Supine Heel Slide  - 2 x daily - 7 x weekly - 1 sets - 10 reps - Supine Quad Set  - 2 x daily - 7 x weekly - 1 sets - 10 reps - Supine Active Straight Leg Raise  - 2 x daily - 7 x weekly - 1 sets - 10 reps - Sidelying Hip Abduction  - 2 x daily - 7 x weekly - 2 sets - 10 reps - Seated Long Arc Quad  - 2 x daily - 7 x weekly - 2 sets - 10 reps - Backwards Walking  - 2 x daily - 7 x weekly - 1 sets - 10 reps - Standing Terminal Knee Extension at Wall with Ball  - 2 x daily - 7 x weekly - 1 sets - 10 reps  ASSESSMENT:  CLINICAL IMPRESSION: The patient arrives to therapy today with pain along medial aspect of the L knee. PT emphasized progression of strengthening, ROM, and focused on normalizing gait pattern. Patient is able to demo heel strike with cues. He tolerated treatment well today with HEP updated.   EVAL: Patient is a 64 y.o. male who was seen today for physical therapy evaluation and treatment for L knee unilateral compartment arthroplasty. He presents today with dec'd AROM, mild edema, mild pain, gait deviations, dec'd balance, and dec'd functional mobility. PT to address deficits to promote improved mobility and return to prior recreational activities.   OBJECTIVE IMPAIRMENTS: Abnormal gait, decreased activity tolerance, decreased ROM, decreased strength, hypomobility, increased fascial restrictions, impaired flexibility, and pain.   GOALS: Goals reviewed with patient? Yes  SHORT TERM GOALS: Target date: 07/22/24  The patient will be indep with initial HEP Baseline: initiated at eval Goal  status: INITIAL  2.  The patient will improve knee AROM to 95 degrees flexion.  Baseline:  78 deg Goal status: INITIAL  3.  The patient will perform SLR x 10 reps without increased pain. Baseline:  Medial knee pain Goal status: INITIAL   LONG TERM GOALS: Target date: 08/20/24  The patient will be indep with progression of HEP. Baseline:  initiated at eval Goal status: INITIAL  2.  The patient will improve LEFS by 18%  to demonstrate improved functional abilities. Baseline: 60% Goal status: INITIAL  3.  The patient will demonstrate stair negotiation with reciprocal pattern x 8 steps mod indep. Baseline:  step to pattern. Goal status: INITIAL  4.  The patient will improve L knee AROM to 115 degrees. Baseline:  78 deg Goal status: INITIAL  5.  The patient will improve gait speed to > or equal to 3.0 ft/sec. Baseline: 1.82 ft/sec Goal status: INITIAL  6.  The patient will return to gym routine, begin return to recreational activities. Baseline:  Not able Goal status: INITIAL   PLAN:  PT FREQUENCY: 2x/week  PT DURATION: 8 weeks  PLANNED INTERVENTIONS: 97164- PT Re-evaluation, 97750- Physical Performance Testing, 97110-Therapeutic exercises, 97530- Therapeutic activity, [id number]- Neuromuscular re-education, 97535- Self Care, 02859- Manual therapy, [id number]- Gait training, [phone]- Electrical stimulation (unattended), [phone]- Electrical stimulation (manual), [phone] (1-2 muscles), 20561 (3+ muscles)- Dry Needling, Patient/Family education, Balance training, Stair training,  Taping, Joint mobilization, and Cryotherapy  PLAN FOR NEXT SESSION: progress HEP, gait training, stair negotiation, ROM, check dressing and change if needed.    Tanai Bouler, PT 06/23/2024, 11:50 AM  "

## 2024-06-28 ENCOUNTER — Encounter: Payer: Self-pay | Admitting: Rehabilitative and Restorative Service Providers"

## 2024-06-28 ENCOUNTER — Ambulatory Visit: Admitting: Rehabilitative and Restorative Service Providers"

## 2024-06-28 DIAGNOSIS — G8929 Other chronic pain: Secondary | ICD-10-CM

## 2024-06-28 DIAGNOSIS — M6281 Muscle weakness (generalized): Secondary | ICD-10-CM

## 2024-06-28 DIAGNOSIS — R600 Localized edema: Secondary | ICD-10-CM

## 2024-06-28 DIAGNOSIS — R262 Difficulty in walking, not elsewhere classified: Secondary | ICD-10-CM

## 2024-06-30 ENCOUNTER — Ambulatory Visit (INDEPENDENT_AMBULATORY_CARE_PROVIDER_SITE_OTHER): Admitting: Orthopaedic Surgery

## 2024-06-30 ENCOUNTER — Ambulatory Visit: Admitting: Rehabilitative and Restorative Service Providers"

## 2024-06-30 ENCOUNTER — Ambulatory Visit (HOSPITAL_BASED_OUTPATIENT_CLINIC_OR_DEPARTMENT_OTHER)

## 2024-06-30 ENCOUNTER — Encounter: Payer: Self-pay | Admitting: Rehabilitative and Restorative Service Providers"

## 2024-06-30 DIAGNOSIS — G8929 Other chronic pain: Secondary | ICD-10-CM

## 2024-06-30 DIAGNOSIS — R262 Difficulty in walking, not elsewhere classified: Secondary | ICD-10-CM

## 2024-06-30 DIAGNOSIS — M6281 Muscle weakness (generalized): Secondary | ICD-10-CM

## 2024-06-30 DIAGNOSIS — M25562 Pain in left knee: Secondary | ICD-10-CM

## 2024-06-30 DIAGNOSIS — R600 Localized edema: Secondary | ICD-10-CM

## 2024-06-30 NOTE — Progress Notes (Signed)
 "                                Post Operative Evaluation    Procedure/Date of Surgery: Left knee medial partial knee arthroplasty 1/22  Interval History:    Presents today 2 weeks status post above procedure.  Overall he is doing extremely well.  He has some anticipated bruising.  He is walking without assistive devices   PMH/PSH/Family History/Social History/Meds/Allergies:   No past medical history on file. No past surgical history on file. Social History   Socioeconomic History   Marital status: Married    Spouse name: Not on file   Number of children: Not on file   Years of education: Not on file   Highest education level: Not on file  Occupational History   Not on file  Tobacco Use   Smoking status: Every Day    Current packs/day: 0.25    Types: Cigarettes   Smokeless tobacco: Current   Tobacco comments:    smoking 1/3 pack daily 06/04/2020  Substance and Sexual Activity   Alcohol use: Yes   Drug use: Never   Sexual activity: Not on file  Other Topics Concern   Not on file  Social History Narrative   Not on file   Social Drivers of Health   Tobacco Use: High Risk (06/28/2024)   Patient History    Smoking Tobacco Use: Every Day    Smokeless Tobacco Use: Current    Passive Exposure: Not on file  Financial Resource Strain: Not on file  Food Insecurity: Not on file  Transportation Needs: Not on file  Physical Activity: Not on file  Stress: Not on file  Social Connections: Not on file  Depression (EYV7-0): Not on file  Alcohol Screen: Not on file  Housing: Not on file  Utilities: Not on file  Health Literacy: Not on file   Family History  Problem Relation Age of Onset   Basal cell carcinoma Brother    Heart attack Mother    Allergies[1] Current Outpatient Medications  Medication Sig Dispense Refill   aspirin  EC 325 MG tablet Take 1 tablet (325 mg total) by mouth daily. 14 tablet 0   atorvastatin (LIPITOR) 20 MG tablet 1 tablet     oxyCODONE   (ROXICODONE ) 5 MG immediate release tablet Take 1 tablet (5 mg total) by mouth every 4 (four) hours as needed for severe pain (pain score 7-10) or breakthrough pain. 30 tablet 0   sodium hyaluronate, viscosup, (GELSYN-3) 16.8 MG/2ML SOSY intra-articular injection Dr. Joane to inj 2 mL, intra-articular, right knee, once weekly x 3 weeks 6 mL 0   No current facility-administered medications for this visit.   No results found.  Review of Systems:   A ROS was performed including pertinent positives and negatives as documented in the HPI.   Musculoskeletal Exam:    There were no vitals taken for this visit.  Left knee incisions well-appearing without erythema or drainage.  Range of motion is from 0 to 90 degrees.  Walks with nonantalgic gait  Imaging:      I personally reviewed and interpreted the radiographs.   Assessment:   2 weeks status post left knee medial unicompartmental knee arthroplasty overall doing extremely well.  This time he will continue to progress according to the rehab protocol and I will plan to see him back in 4 weeks for reassessment  Plan :    -  Return to clinic 4 weeks for reassessment      I personally saw and evaluated the patient, and participated in the management and treatment plan.  Elspeth Parker, MD Attending Physician, Orthopedic Surgery  This document was dictated using Dragon voice recognition software. A reasonable attempt at proof reading has been made to minimize errors.    [1] No Known Allergies  "

## 2024-06-30 NOTE — Therapy (Signed)
 " OUTPATIENT PHYSICAL THERAPY LOWER EXTREMITY TREATMENT   Patient Name: Corey Lynch MRN: [id number] DOB:1961-05-11, 64 y.o., male Today's Date: 06/30/2024  END OF SESSION:  PT End of Session - 06/30/24 1355     Visit Number 4    Number of Visits 16    Date for Recertification  08/20/24    Authorization Type [employer] P[employer]    PT Start Time 1356    PT Stop Time 1445    PT Time Calculation (min) 49 min    Activity Tolerance Patient tolerated treatment well    Behavior During Therapy [facility] for tasks assessed/performed           Patient Active Problem List   Diagnosis Date Noted   Primary osteoarthritis of right knee 04/20/2023   Chronic pain of right knee 04/20/2023   Pseudogout of right knee 04/20/2023   Acute lateral meniscal tear, right, initial encounter 07/01/2022   Degenerative disc disease, cervical 10/11/2019   Nonallopathic lesion of cervical region 10/11/2019   Nonallopathic lesion of thoracic region 10/11/2019    PCP: Dibas  Regino, MD REFERRING PROVIDER: Elspeth Parker, MD  REFERRING DIAG:  Diagnosis  S83.207A (ICD-10-CM) - Acute meniscal tear of left knee, initial encounter    THERAPY DIAG:  Difficulty in walking, not elsewhere classified  Muscle weakness (generalized)  Localized edema  Chronic pain of right knee  Rationale for Evaluation and Treatment: Rehabilitation  ONSET DATE: 06/16/24  SUBJECTIVE:  SUBJECTIVE STATEMENT: The patient saw surgeon this morning with good check up.-- no change in plans. The patient was sore after last session in medial knee.   EVAL: The patient is s/p L knee unilateral medial comparment arthroplasty 06/16/24. He has h/o L knee meniscus tear and notes limping due to bilateral knee pain for the last 18 months. He is not using an assistive device since surgery and is icing/elevating at home. He has not used narcotic meds since Sunday morning. He inquired about driving (shared Dr. Danetta post surgical information re:  driving).   PERTINENT HISTORY: OA of R knee, meniscus tear R side, h/o L meniscus tear  PAIN:  Are you having pain? Yes: NPRS scale:  uncomfortable 2/10 at times. Pain location: L knee  Pain description: discomfort Aggravating factors: walking Relieving factors: ice, elevation  PRECAUTIONS: None  WEIGHT BEARING RESTRICTIONS: Yes WBAT  FALLS:  Has patient fallen in last 6 months? 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   NEXT MD VISIT: 06/30/24  OBJECTIVE:  Note: Objective measures were completed at Evaluation unless otherwise noted.  DIAGNOSTIC FINDINGS: MRI left knee//prior to surgery Meniscal root tear variant with medial meniscal extrusion.  Tibial bone bruise with chondral loss involving the medial tibiofemoral joint otherwise preserved lateral and patellofemoral joints  PATIENT SURVEYS:  LEFS =60%   COGNITION: Overall cognitive status: Within functional limits for tasks assessed     SENSATION: WFL  EDEMA:  Mild edema noted  PT changed initial bandage-- removed bandaging with heavy bloody exudate and placed clean 4x4 with tegaderm over L knee. Provided dressing for home change with instruction to keep wound clean/dry. Extra bandage if there is  any drainage or moisture and he needs to replace before his next PT session.   POSTURE: No Significant postural limitations  PALPATION: Mild edema   LOWER EXTREMITY ROM: Active ROM Right eval Left eval Left 06/28/24  Hip flexion     Hip extension     Hip abduction     Hip adduction     Hip internal rotation     Hip external rotation      Knee flexion  78 95 AROM heel slide  Knee extension  0 -5 degrees from full extension;  -18 extensor lag during LAQ   Ankle dorsiflexion     Ankle plantarflexion     Ankle inversion     Ankle eversion      (Blank rows = not tested)  LOWER EXTREMITY MMT: TO BE ASSESSED MMT Right eval Left eval  Hip flexion    Hip extension    Hip abduction    Hip adduction    Hip internal rotation    Hip external rotation    Knee flexion    Knee extension    Ankle dorsiflexion    Ankle plantarflexion    Ankle inversion    Ankle eversion     (Blank rows = not tested)  FUNCTIONAL TESTS:  Gait speed=1.82 ft/sec  GAIT: Distance walked: 75 feet Assistive device utilized: None Level of assistance: Modified independence Comments: Gait characterized by clicking sensation medial L knee, he has decreased knee flexion with initial swing leading to a vaulting gait pattern  Stairs with step to pattern mod indep                   [facility] Adult PT Treatment:                                                DATE: 06/30/24 Therapeutic Exercise: Bike  Rocking half revolutions with knee flexor stretch Supine Heel slides x 6 reps  SLR x 8 reps with cues on quad set SLR with hip ER x 2 reps-- painful today so discontinued Quad sets with heel on deflated ball x 10 reps Therapeutic Activity: Seated  BOSU press downs into extension for quad activation with extension x 10 reps Standing Single leg standing with lateral toe taps and posterior toe taps with stability in L LE x 10 reps Single leg standing with anterior toe taps 6 reps-- some discomfort as knee flexes with eccentric quad contraction Step ups to 4 step and step down to 2 surface--- worked on concentric and eccentric control x 10 reps Gait: Forward/backwards walking x 20 feet Slow marching x 20 feet x 2 reps Sidestepping x 20 feet x 4 reps    [facility] Adult PT Treatment:                                                DATE: 06/28/24 Therapeutic  Exercise: Bike  Rocking half revolutions with knee flexor stretch Supine Heel slides x 6 reps  SLR x 8 reps with cues on quad set SLR with hip ER x 5 reps  Bridges x 10 reps  Seated Isometric holds 5 seconds flexion and extension x 5 reps Therapeutic Activity: Step ups 1 on compliant surfaces Lateral  step ups Single limb standing x 5 seconds  Standing knee flexion x 5 reps Sidestepping x 15 feet x 4 reps Heel raises x 10 reps Squats (partial) x 10 reps near support surface Gait: Cues for heel strike Backward walking marching Modalities: Vasopneumatic x 10 minutes   [facility] Adult PT Treatment:                                                DATE: 06/23/24 Therapeutic Exercise: Supine Heel slides x 10 reps Knee felxion on physioball x 10 reps Quad set x 5 reps SLR x 10reps HS stretch with strap x 2 reps Sidelying Hip abduction x 10 reps Seated Long arc quad x 10 reps Standing Terminal knee extension x 10 reps Heel cord stretch Gait: Ambulation with heel strike working on normalizing gait pattern Backward walking x 20 feet x 4 reps Modalities: Vasopneumatic x 10 minutes L knee x 34 degrees                                                                                                               [facility] Adult PT Treatment:                                                DATE: 06/21/24 Therapeutic Exercise: Standing Heel raises Supine  SLR --initiated--- did not give for HEP due to more elevated pain Heel slides -- x 5 reps Quad sets x 5 reps Self Care: Education re: dressing changes Education re: driving  PATIENT EDUCATION:  Education details: Plan of care, wound care, driving Person educated: Patient Education method: Explanation Education comprehension: verbalized understanding  HOME EXERCISE PROGRAM: Access Code: [email or web address] URL: [email or web address] Date: 06/23/2024 Prepared by: Tawni Ferrier  Exercises - Supine Heel Slide  - 2 x  daily - 7 x weekly - 1 sets - 10 reps - Supine Quad Set  - 2 x daily - 7 x weekly - 1 sets - 10 reps - Supine Active Straight Leg Raise  - 2 x daily - 7 x weekly - 1 sets - 10 reps - Sidelying Hip Abduction  - 2 x daily - 7 x weekly - 2 sets - 10 reps - Seated Long Arc Quad  - 2 x daily - 7 x weekly - 2 sets - 10 reps - Backwards Walking  - 2 x daily - 7 x weekly - 1 sets - 10 reps - Standing Terminal Knee Extension at Wall with Ball  - 2 x daily - 7 x weekly - 1 sets - 10 reps  ASSESSMENT:  CLINICAL IMPRESSION: The patient saw MD today with expected progress. He is improving with gait normalization (mild antalgic gait at times). PT focusing on quad contraction, strengthening and full ROM. Plan  to continue to patient tolerance.  Patient has met STGs ahead of schedule.   EVAL: Patient is a 64 y.o. male who was seen today for physical therapy evaluation and treatment for L knee unilateral compartment arthroplasty. He presents today with dec'd AROM, mild edema, mild pain, gait deviations, dec'd balance, and dec'd functional mobility. PT to address deficits to promote improved mobility and return to prior recreational activities.   OBJECTIVE IMPAIRMENTS: Abnormal gait, decreased activity tolerance, decreased ROM, decreased strength, hypomobility, increased fascial restrictions, impaired flexibility, and pain.   GOALS: Goals reviewed with patient? Yes  SHORT TERM GOALS: Target date: 07/22/24  The patient will be indep with initial HEP Baseline: initiated at eval Goal status:MET on 06/28/24  2.  The patient will improve knee AROM to 95 degrees flexion.  Baseline:  78 deg Goal status: MET on 06/28/24  3.  The patient will perform SLR x 10 reps without increased pain. Baseline:  Medial knee pain Goal status:MET on 06/28/24   LONG TERM GOALS: Target date: 08/20/24  The patient will be indep with progression of HEP. Baseline:  initiated at eval Goal status: INITIAL  2.  The patient will  improve LEFS by 18%  to demonstrate improved functional abilities. Baseline: 60% Goal status: INITIAL  3.  The patient will demonstrate stair negotiation with reciprocal pattern x 8 steps mod indep. Baseline:  step to pattern. Goal status: INITIAL  4.  The patient will improve L knee AROM to 115 degrees. Baseline:  78 deg Goal status: INITIAL  5.  The patient will improve gait speed to > or equal to 3.0 ft/sec. Baseline: 1.82 ft/sec Goal status: INITIAL  6.  The patient will return to gym routine, begin return to recreational activities. Baseline:  Not able Goal status: INITIAL   PLAN:  PT FREQUENCY: 2x/week  PT DURATION: 8 weeks  PLANNED INTERVENTIONS: [id number]- PT Re-evaluation, 97750- Physical Performance Testing, 97110-Therapeutic exercises, 97530- Therapeutic activity, [id number]- Neuromuscular re-education, 97535- Self Care, [id number]- Manual therapy, [id number]- Gait training, [phone]- Electrical stimulation (unattended), [phone]- Electrical stimulation (manual), 20560 (1-2 muscles), 20561 (3+ muscles)- Dry Needling, Patient/Family education, Balance training, Stair training, Taping, Joint mobilization, and Cryotherapy  PLAN FOR NEXT SESSION: progress HEP, gait training, stair negotiation, ROM.    Jolonda Gomm, PT 06/30/2024, 1:56 PM  "

## 2024-07-05 ENCOUNTER — Ambulatory Visit: Admitting: Rehabilitative and Restorative Service Providers"

## 2024-07-07 ENCOUNTER — Ambulatory Visit: Admitting: Rehabilitative and Restorative Service Providers"

## 2024-07-29 ENCOUNTER — Encounter (HOSPITAL_BASED_OUTPATIENT_CLINIC_OR_DEPARTMENT_OTHER): Admitting: Orthopaedic Surgery

## 2024-08-15 ENCOUNTER — Other Ambulatory Visit
